# Patient Record
Sex: Male | Born: 2000
Health system: Southern US, Community
[De-identification: ages and names within clinical notes are randomized; demographics above are authoritative.]

## PROBLEM LIST (undated history)

## (undated) DIAGNOSIS — J309 Allergic rhinitis, unspecified: Secondary | ICD-10-CM

## (undated) DIAGNOSIS — L309 Dermatitis, unspecified: Secondary | ICD-10-CM

## (undated) DIAGNOSIS — R000 Tachycardia, unspecified: Secondary | ICD-10-CM

## (undated) HISTORY — DX: Allergic rhinitis, unspecified: J30.9

## (undated) HISTORY — PX: OTHER SURGICAL HISTORY: SHX169

## (undated) HISTORY — DX: Tachycardia, unspecified: R00.0

## (undated) HISTORY — DX: Dermatitis, unspecified: L30.9

---
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
## 2020-03-29 ENCOUNTER — Encounter: Payer: BC Managed Care – PPO | Admitting: Nurse Practitioner

## 2020-05-02 ENCOUNTER — Encounter: Payer: BC Managed Care – PPO | Admitting: Nurse Practitioner

## 2020-06-02 ENCOUNTER — Encounter: Payer: BC Managed Care – PPO | Admitting: Nurse Practitioner

## 2021-02-17 ENCOUNTER — Other Ambulatory Visit: Payer: Self-pay | Admitting: Nurse Practitioner

## 2021-02-17 ENCOUNTER — Other Ambulatory Visit: Payer: Self-pay

## 2021-02-17 ENCOUNTER — Encounter: Payer: Self-pay | Admitting: Nurse Practitioner

## 2021-02-17 ENCOUNTER — Ambulatory Visit (INDEPENDENT_AMBULATORY_CARE_PROVIDER_SITE_OTHER): Payer: BC Managed Care – PPO | Admitting: Nurse Practitioner

## 2021-02-17 VITALS — BP 141/84 | HR 86 | Temp 98.4°F | Resp 20 | Ht 71.0 in | Wt 152.0 lb

## 2021-02-17 DIAGNOSIS — N492 Inflammatory disorders of scrotum: Secondary | ICD-10-CM

## 2021-02-17 NOTE — Progress Notes (Signed)
Ordered changed to u/s scrotom with doppler

## 2021-02-17 NOTE — Progress Notes (Signed)
   Subjective:    Patient ID: Charles May, male    DOB: [birth year], 20 y.o.   MRN: [id number]   Chief Complaint: 2 spots near genital area   HPI Patient come sn with 2 little lumps on top of testicles. Noticed a couple of months ago. Have not changed in size. Nontender.    Review of Systems  Constitutional:  Negative for diaphoresis.  Eyes:  Negative for pain.  Respiratory:  Negative for shortness of breath.   Cardiovascular:  Negative for chest pain, palpitations and leg swelling.  Gastrointestinal:  Negative for abdominal pain.  Endocrine: Negative for polydipsia.  Skin:  Negative for rash.  Neurological:  Negative for dizziness, weakness and headaches.  Hematological:  Does not bruise/bleed easily.  All other systems reviewed and are negative.     Objective:   Physical Exam Constitutional:      Appearance: Normal appearance.  Cardiovascular:     Rate and Rhythm: Normal rate and regular rhythm.     Heart sounds: Normal heart sounds.  Pulmonary:     Effort: Pulmonary effort is normal.     Breath sounds: Normal breath sounds.  Genitourinary:    Penis: Normal.      Comments: 3cm nontender nodule upper left scrotal sac. Skin:    General: Skin is warm.  Neurological:     General: No focal deficit present.     Mental Status: He is alert and oriented to person, place, and time.  Psychiatric:        Mood and Affect: Mood normal.        Behavior: Behavior normal.    BP (!) 141/84   Pulse 86   Temp 98.4 F (36.9 C) (Temporal)   Resp 20   Ht 5\' 11"  (1.803 m)   Wt 152 lb (68.9 kg)   HC 20" (50.8 cm)   SpO2 100%   BMI 21.20 kg/m        Assessment & Plan:   in today with chief complaint of 2 spots near genital area   1. Scrotal nodule Continue to check for change in size Will call with schedule time for procedure - [name]; Future    The above assessment and management plan was discussed with the patient. The patient  verbalized understanding of and has agreed to the management plan. Patient is aware to call the clinic if symptoms persist or worsen. Patient is aware when to return to the clinic for a follow-up visit. Patient educated on when it is appropriate to go to the emergency department.   Charles [place], FNP

## 2021-02-24 ENCOUNTER — Ambulatory Visit (HOSPITAL_COMMUNITY)
Admission: RE | Admit: 2021-02-24 | Discharge: 2021-02-24 | Disposition: A | Discharge status: Home / Self Care | Payer: BC Managed Care – PPO | Source: Ambulatory Visit | Attending: Nurse Practitioner | Admitting: Nurse Practitioner

## 2021-02-24 ENCOUNTER — Other Ambulatory Visit: Payer: Self-pay

## 2021-02-24 DIAGNOSIS — N503 Cyst of epididymis: Secondary | ICD-10-CM | POA: Diagnosis not present

## 2021-02-24 DIAGNOSIS — N492 Inflammatory disorders of scrotum: Secondary | ICD-10-CM | POA: Insufficient documentation

## 2021-03-02 ENCOUNTER — Ambulatory Visit (INDEPENDENT_AMBULATORY_CARE_PROVIDER_SITE_OTHER): Payer: BC Managed Care – PPO | Admitting: Nurse Practitioner

## 2021-03-02 ENCOUNTER — Other Ambulatory Visit: Payer: Self-pay

## 2021-03-02 ENCOUNTER — Encounter: Payer: Self-pay | Admitting: Nurse Practitioner

## 2021-03-02 VITALS — BP 132/84 | HR 76 | Temp 98.1°F | Resp 20 | Ht 71.0 in | Wt 159.0 lb

## 2021-03-02 DIAGNOSIS — Z0000 Encounter for general adult medical examination without abnormal findings: Secondary | ICD-10-CM

## 2021-03-02 NOTE — Patient Instructions (Signed)
Exercising to Stay Healthy °To become healthy and stay healthy, it is recommended that you do moderate-intensity and vigorous-intensity exercise. You can tell that you are exercising at a moderate intensity if your heart starts beating faster and you start breathing faster but can still hold a conversation. You can tell that you are exercising at a vigorous intensity if you are breathing much harder and faster and cannot hold a conversation while exercising. °How can exercise benefit me? °Exercising regularly is important. It has many health benefits, such as: °Improving overall fitness, flexibility, and endurance. °Increasing bone density. °Helping with weight control. °Decreasing body fat. °Increasing muscle strength and endurance. °Reducing stress and tension, anxiety, depression, or anger. °Improving overall health. °What guidelines should I follow while exercising? °Before you start a new exercise program, talk with your health care provider. °Do not exercise so much that you hurt yourself, feel dizzy, or get very short of breath. °Wear comfortable clothes and wear shoes with good support. °Drink plenty of water while you exercise to prevent dehydration or heat stroke. °Work out until your breathing and your heartbeat get faster (moderate intensity). °How often should I exercise? °Choose an activity that you enjoy, and set realistic goals. Your health care provider can help you make an activity plan that is individually designed and works best for you. °Exercise regularly as told by your health care provider. This may include: °Doing strength training two times a week, such as: °Lifting weights. °Using resistance bands. °Push-ups. °Sit-ups. °Yoga. °Doing a certain intensity of exercise for a given amount of time. Choose from these options: °A total of 150 minutes of moderate-intensity exercise every week. °A total of 75 minutes of vigorous-intensity exercise every week. °A mix of moderate-intensity and  vigorous-intensity exercise every week. °Children, pregnant women, people who have not exercised regularly, people who are overweight, and older adults may need to talk with a health care provider about what activities are safe to perform. If you have a medical condition, be sure to talk with your health care provider before you start a new exercise program. °What are some exercise ideas? °Moderate-intensity exercise ideas include: °Walking 1 mile (1.6 km) in about 15 minutes. °Biking. °Hiking. °Golfing. °Dancing. °Water aerobics. °Vigorous-intensity exercise ideas include: °Walking 4.5 miles (7.2 km) or more in about 1 hour. °Jogging or running 5 miles (8 km) in about 1 hour. °Biking 10 miles (16.1 km) or more in about 1 hour. °Lap swimming. °Roller-skating or in-line skating. °Cross-country skiing. °Vigorous competitive sports, such as football, basketball, and soccer. °Jumping rope. °Aerobic dancing. °What are some everyday activities that can help me get exercise? °Yard work, such as: °Pushing a lawn mower. °Raking and bagging leaves. °Washing your car. °Pushing a stroller. °Shoveling snow. °Gardening. °Washing windows or floors. °How can I be more active in my day-to-day activities? °Use stairs instead of an elevator. °Take a walk during your lunch break. °If you drive, park your car farther away from your work or school. °If you take public transportation, get off one stop early and walk the rest of the way. °Stand up or walk around during all of your indoor phone calls. °Get up, stretch, and walk around every 30 minutes throughout the day. °Enjoy exercise with a friend. Support to continue exercising will help you keep a regular routine of activity. °Where to find more information °You can find more information about exercising to stay healthy from: °[place] Department of Health and Human Services: www.hhs.gov °Centers for Disease Control and Prevention (  CDC): www.cdc.gov °Summary °Exercising regularly is  important. It will improve your overall fitness, flexibility, and endurance. °Regular exercise will also improve your overall health. It can help you control your weight, reduce stress, and improve your bone density. °Do not exercise so much that you hurt yourself, feel dizzy, or get very short of breath. °Before you start a new exercise program, talk with your health care provider. °This information is not intended to replace advice given to you by your health care provider. Make sure you discuss any questions you have with your health care provider. °Document Revised: 09/16/2020 Document Reviewed: 09/16/2020 °Elsevier Patient Education © 2022 Elsevier Inc. ° °

## 2021-03-02 NOTE — Progress Notes (Signed)
   Subjective:    Patient ID: Charles May, male    DOB: July 29, 2000, 20 y.o.   MRN: [id number]   Chief Complaint: Annual Exam    HPI: Patient is here today for annual physical. He is doing well without complaints. He had a scrotal nodule and u/s showed benign nodule.  There are no diagnoses linked to this encounter.   No outpatient encounter medications on file as of 03/02/2021.   No facility-administered encounter medications on file as of 03/02/2021.    History reviewed. No pertinent surgical history.  History reviewed. No pertinent family history.  New complaints: None today  Social history: Lives with parents  Controlled substance contract: n/a     Review of Systems  Constitutional:  Negative for diaphoresis.  Eyes:  Negative for pain.  Respiratory:  Negative for shortness of breath.   Cardiovascular:  Negative for chest pain, palpitations and leg swelling.  Gastrointestinal:  Negative for abdominal pain.  Endocrine: Negative for polydipsia.  Skin:  Negative for rash.  Neurological:  Negative for dizziness, weakness and headaches.  Hematological:  Does not bruise/bleed easily.  All other systems reviewed and are negative.     Objective:   Physical Exam Vitals and nursing note reviewed.  Constitutional:      Appearance: Normal appearance. He is well-developed.  HENT:     Head: Normocephalic.     Nose: Nose normal.  Eyes:     Pupils: Pupils are equal, round, and reactive to light.  Neck:     Thyroid: No thyroid mass or thyromegaly.     Vascular: No carotid bruit or JVD.     Trachea: Phonation normal.  Cardiovascular:     Rate and Rhythm: Normal rate and regular rhythm.  Pulmonary:     Effort: Pulmonary effort is normal. No respiratory distress.     Breath sounds: Normal breath sounds.  Abdominal:     General: Bowel sounds are normal.     Palpations: Abdomen is soft.     Tenderness: There is no abdominal tenderness.  Musculoskeletal:         General: Normal range of motion.     Cervical back: Normal range of motion and neck supple.  Lymphadenopathy:     Cervical: No cervical adenopathy.  Skin:    General: Skin is warm and dry.  Neurological:     Mental Status: He is alert and oriented to person, place, and time.  Psychiatric:        Behavior: Behavior normal.        Thought Content: Thought content normal.        Judgment: Judgment normal.    BP 132/84   Pulse 76   Temp 98.1 F (36.7 C) (Temporal)   Resp 20   Ht $R'5\' 11"'Js$  (1.803 m)   Wt 159 lb (72.1 kg)   SpO2 99%   BMI 22.18 kg/m        Assessment & Plan:  Charles May comes in today with chief complaint of Annual Exam   Diagnosis and orders addressed:  1. Annual physical exam - CBC with Differential/Platelet - CMP14+EGFR - Lipid panel   Labs pending Health Maintenance reviewed Diet and exercise encouraged  Follow up plan: prn   Mary-Margaret Hassell Done, FNP

## 2021-03-03 LAB — CMP14+EGFR
ALT: 19 IU/L (ref 0–44)
AST: 17 IU/L (ref 0–40)
Albumin/Globulin Ratio: 2.3 — ABNORMAL HIGH (ref 1.2–2.2)
Albumin: 5 g/dL (ref 4.1–5.2)
Alkaline Phosphatase: 82 IU/L (ref 51–125)
BUN/Creatinine Ratio: 12 (ref 9–20)
BUN: 13 mg/dL (ref 6–20)
Bilirubin Total: 0.4 mg/dL (ref 0.0–1.2)
CO2: 23 mmol/L (ref 20–29)
Calcium: 10.1 mg/dL (ref 8.7–10.2)
Chloride: 102 mmol/L (ref 96–106)
Creatinine, Ser: 1.12 mg/dL (ref 0.76–1.27)
Globulin, Total: 2.2 g/dL (ref 1.5–4.5)
Glucose: 86 mg/dL (ref 70–99)
Potassium: 4.2 mmol/L (ref 3.5–5.2)
Sodium: 140 mmol/L (ref 134–144)
Total Protein: 7.2 g/dL (ref 6.0–8.5)
eGFR: 96 mL/min/{1.73_m2} (ref >59)

## 2021-03-03 LAB — LIPID PANEL
Chol/HDL Ratio: 3.8 ratio (ref 0.0–5.0)
Cholesterol, Total: 173 mg/dL (ref 100–199)
HDL: 45 mg/dL (ref >39)
LDL Chol Calc (NIH): 107 mg/dL — ABNORMAL HIGH (ref 0–99)
Triglycerides: 119 mg/dL (ref 0–149)
VLDL Cholesterol Cal: 21 mg/dL (ref 5–40)

## 2021-03-03 LAB — CBC WITH DIFFERENTIAL/PLATELET
Basophils Absolute: 0.1 10*3/uL (ref 0.0–0.2)
Basos: 1 %
EOS (ABSOLUTE): 0.3 10*3/uL (ref 0.0–0.4)
Eos: 5 %
Hematocrit: 44.7 % (ref 37.5–51.0)
Hemoglobin: 15.5 g/dL (ref 13.0–17.7)
Immature Grans (Abs): 0 10*3/uL (ref 0.0–0.1)
Immature Granulocytes: 0 %
Lymphocytes Absolute: 2 10*3/uL (ref 0.7–3.1)
Lymphs: 32 %
MCH: 30.6 pg (ref 26.6–33.0)
MCHC: 34.7 g/dL (ref 31.5–35.7)
MCV: 88 fL (ref 79–97)
Monocytes Absolute: 0.5 10*3/uL (ref 0.1–0.9)
Monocytes: 7 %
Neutrophils Absolute: 3.3 10*3/uL (ref 1.4–7.0)
Neutrophils: 55 %
Platelets: 198 10*3/uL (ref 150–450)
RBC: 5.06 x10E6/uL (ref 4.14–5.80)
RDW: 12.2 % (ref 11.6–15.4)
WBC: 6.1 10*3/uL (ref 3.4–10.8)

## 2021-06-16 DIAGNOSIS — R002 Palpitations: Secondary | ICD-10-CM | POA: Diagnosis not present

## 2021-06-16 DIAGNOSIS — R079 Chest pain, unspecified: Secondary | ICD-10-CM | POA: Diagnosis not present

## 2021-06-19 ENCOUNTER — Emergency Department (HOSPITAL_COMMUNITY)
Admission: EM | Admit: 2021-06-19 | Discharge: 2021-06-19 | Disposition: A | Discharge status: Home / Self Care | Payer: BC Managed Care – PPO | Attending: Emergency Medicine | Admitting: Emergency Medicine

## 2021-06-19 ENCOUNTER — Encounter (HOSPITAL_COMMUNITY): Payer: Self-pay

## 2021-06-19 ENCOUNTER — Other Ambulatory Visit: Payer: Self-pay

## 2021-06-19 ENCOUNTER — Emergency Department (HOSPITAL_COMMUNITY): Payer: BC Managed Care – PPO

## 2021-06-19 DIAGNOSIS — R002 Palpitations: Secondary | ICD-10-CM | POA: Diagnosis not present

## 2021-06-19 DIAGNOSIS — R079 Chest pain, unspecified: Secondary | ICD-10-CM | POA: Diagnosis not present

## 2021-06-19 LAB — BASIC METABOLIC PANEL
Anion gap: 11 (ref 5–15)
BUN: 12 mg/dL (ref 6–20)
CO2: 24 mmol/L (ref 22–32)
Calcium: 9.8 mg/dL (ref 8.9–10.3)
Chloride: 103 mmol/L (ref 98–111)
Creatinine, Ser: 1.2 mg/dL (ref 0.61–1.24)
GFR, Estimated: >60 mL/min (ref >60)
Glucose, Bld: 98 mg/dL (ref 70–99)
Potassium: 4.3 mmol/L (ref 3.5–5.1)
Sodium: 138 mmol/L (ref 135–145)

## 2021-06-19 LAB — CBC
HCT: 44.9 % (ref 39.0–52.0)
Hemoglobin: 15.9 g/dL (ref 13.0–17.0)
MCH: 31.4 pg (ref 26.0–34.0)
MCHC: 35.4 g/dL (ref 30.0–36.0)
MCV: 88.7 fL (ref 80.0–100.0)
Platelets: 213 10*3/uL (ref 150–400)
RBC: 5.06 MIL/uL (ref 4.22–5.81)
RDW: 11.8 % (ref 11.5–15.5)
WBC: 5 10*3/uL (ref 4.0–10.5)
nRBC: 0 % (ref 0.0–0.2)

## 2021-06-19 LAB — TROPONIN I (HIGH SENSITIVITY)
Troponin I (High Sensitivity): <2 ng/L (ref <18)
Troponin I (High Sensitivity): <2 ng/L (ref <18)

## 2021-06-19 NOTE — ED Triage Notes (Signed)
Pt arrived POV from home c/o CP that has been ongoing for about a week. Pt states his CP is on the left side. Pt also states he has been nauseous and vomited a few times. Per urgent care EKG and labs were normal sent here for further work up.

## 2021-06-19 NOTE — ED Provider Triage Note (Signed)
Emergency Medicine Provider Triage Evaluation Note  Charles May , a 21 y.o. male  was evaluated in triage.  Pt complains of left-sided chest pain x1 week.  He has associated resolved nausea, resolved nonbloody emesis.  Patient was evaluated in urgent care and sent to the ED for further evaluation.  He notes that his symptoms started a week ago after taking preworkout and noticing palpitations.  He notices this chest pain when he is up and active.  has not tried any medications for his symptoms.  Denies shortness of breath, fever, chills, abdominal pain.  Denies history of MI, CAD, hypertension, diabetes, cardiac catheterization.  Denies family history of MI in individual younger than 48.  Review of Systems  Positive: As per HPI above. Negative: Shortness of breath, fever, chills  Physical Exam  BP (!) 137/91 (BP Location: Right Arm)    Pulse 86    Temp 98.4 F (36.9 C) (Oral)    Resp 18    Ht 5\' 11"  (1.803 m)    Wt 72.6 kg    SpO2 99%    BMI 22.32 kg/m  Gen:   Awake, no distress   Resp:  Normal effort  MSK:   Moves extremities without difficulty  Other:  No chest wall tenderness to palpation.  Medical Decision Making  Medically screening exam initiated at 12:54 PM.  Appropriate orders placed.  Yahmir Sokolov was informed that the remainder of the evaluation will be completed by another provider, this initial triage assessment does not replace that evaluation, and the importance of remaining in the ED until their evaluation is complete.   Delicia Berens A, PA-C 06/19/21 1301

## 2021-06-19 NOTE — Discharge Instructions (Signed)
You were seen and evaluated in our ED on 06/19/21.  Reassuring heart enzymes and EKG.  Please follow up with the results of your Zio patch with your PCP.

## 2021-06-19 NOTE — ED Provider Notes (Signed)
[facility] EMERGENCY DEPARTMENT Provider Note   CSN: [id number] Arrival date & time: 06/19/21  1224     History  Chief Complaint  Patient presents with   Chest Pain    Aerik Polan is a 21 y.o. male.   Chest Pain Associated symptoms: palpitations    This is a 21 yo male with no significant PMH who presents to our ED for evaluation of palpitations and chest pains.   Patient states that he has been having intermittent chest pains and palpitations for the past week. His symptoms started after consuming (Ghost) Pre-workout. The patient initially presented to an urgent care where he had a reassuring workup and a zio patch was placed.   Patient also reports that he also has started to have anxiety recently when entering rooms. Denies trauma, chills, fevers, sob, leg swelling. Denies any family history of sudden cardiac death. Denies drug use. Has discontinued pre-workout usage.      Home Medications Prior to Admission medications   Medication Sig Start Date End Date Taking? Authorizing Provider  ibuprofen (ADVIL) 200 MG tablet Take 200 mg by mouth every 6 (six) hours as needed for fever or mild pain.   Yes [provider]      Allergies    Patient has no known allergies.    Review of Systems   Review of Systems  Cardiovascular:  Positive for chest pain and palpitations.  All other systems reviewed and are negative.  Physical Exam Updated Vital Signs BP 131/67    Pulse 67    Temp 99.4 F (37.4 C) (Oral)    Resp 20    Ht 5\' 11"  (1.803 m)    Wt 72.6 kg    SpO2 98%    BMI 22.32 kg/m  Physical Exam Vitals and nursing note reviewed.  Constitutional:      General: He is not in acute distress.    Appearance: He is well-developed.  HENT:     Head: Normocephalic and atraumatic.  Eyes:     Conjunctiva/sclera: Conjunctivae normal.  Cardiovascular:     Rate and Rhythm: Normal rate and regular rhythm.     Heart sounds: No murmur  heard. Pulmonary:     Effort: Pulmonary effort is normal. No respiratory distress.     Breath sounds: Normal breath sounds.  Abdominal:     Palpations: Abdomen is soft.     Tenderness: There is no abdominal tenderness.  Musculoskeletal:        General: No swelling.     Cervical back: Neck supple.     Right lower leg: No edema.     Left lower leg: No edema.  Skin:    General: Skin is warm and dry.     Capillary Refill: Capillary refill takes less than 2 seconds.  Neurological:     Mental Status: He is alert.  Psychiatric:        Mood and Affect: Mood normal.    ED Results / Procedures / Treatments   Labs (all labs ordered are listed, but only abnormal results are displayed) Labs Reviewed  BASIC METABOLIC PANEL  CBC  TROPONIN I (HIGH SENSITIVITY)  TROPONIN I (HIGH SENSITIVITY)    EKG EKG Interpretation  Date/Time:  Monday June 19 2021 12:54:49 EST Ventricular Rate:  87 PR Interval:  126 QRS Duration: 88 QT Interval:  342 QTC Calculation: 411 R Axis:   90 Text Interpretation: Normal sinus rhythm with sinus arrhythmia Rightward axis Nonspecific T wave abnormality  Abnormal ECG No previous ECGs available Confirmed by Linwood Dibbles [phone]) on 06/19/2021 4:51:26 PM  Radiology DG Chest 2 View  Result Date: 06/19/2021 CLINICAL DATA:  Chest pain EXAM: CHEST - 2 VIEW COMPARISON:  None. FINDINGS: The heart size and mediastinal contours are within normal limits. Both lungs are clear. The visualized skeletal structures are unremarkable. IMPRESSION: No acute abnormality of the lungs. Electronically Signed   By: Jearld Lesch M.D.   On: 06/19/2021 13:11    Procedures Procedures    Medications Ordered in ED Medications - No data to display  ED Course/ Medical Decision Making/ A&P                           Medical Decision Making  This is a 21 yo male presenting for chest pain and palpitations. Patient is HDS afebrile and non-toxic appearing.  Ddx considered but not limited  to the following: ACS, electrolyte abnormalities, dysthymia, anxiety.   Labs reviewed independently: negative troponin doubt ACS, Unremarkable CBC and BMP. CXR reviewed and without cardiac/pulmonary abnormality.   EKG reveals the patient to be in NSR without significant dysthymia. Recommended that the patient follow up with his PCP for results of zio patch. Doubt life threatening cause of palpations.   Will recommend  follow up with both PCP and Cardiology. Strict return precautions were provided which the patient voiced understanding of.     Final Clinical Impression(s) / ED Diagnoses Final diagnoses:  Palpitations    Rx / DC Orders ED Discharge Orders     None         Camila Li, MD 06/19/21 0263    Linwood Dibbles, MD 06/21/21 1649

## 2021-06-20 ENCOUNTER — Ambulatory Visit (INDEPENDENT_AMBULATORY_CARE_PROVIDER_SITE_OTHER): Payer: BC Managed Care – PPO | Admitting: Nurse Practitioner

## 2021-06-20 ENCOUNTER — Encounter: Payer: Self-pay | Admitting: Nurse Practitioner

## 2021-06-20 VITALS — BP 133/76 | HR 64 | Temp 98.7°F | Resp 20 | Ht 71.0 in | Wt 164.0 lb

## 2021-06-20 DIAGNOSIS — F419 Anxiety disorder, unspecified: Secondary | ICD-10-CM

## 2021-06-20 MED ORDER — CITALOPRAM HYDROBROMIDE 20 MG PO TABS: 20.0000 mg | ORAL_TABLET | Freq: Every day | ORAL | 5 refills | Status: DC

## 2021-06-20 NOTE — Progress Notes (Addendum)
Subjective:    Patient ID: Charles May, male    DOB: May 07, 2001, 21 y.o.   MRN: [id number]   Chief Complaint: anxiety  HPI Patient has bene going to the gym with his friends. They always drink a pre-work out prior to working out. For some reason on a couple of weeks ago he was at the gym and his heart started racing and he just did not feel right. He went to the ED and EKG showed no issues. He still did not feel right a few days later and he went back to the ED and again, everything checked out ok. He feels like that this may be anxiety. He says he can be doing nothing and all the sudden his heart will start racing. Can last over an hour when occurs. He says it occurred 3 time while he was sitting I our waiting room.  ( He had a younger brother pas away suddenly a year ago)  GAD 7 : Generalized Anxiety Score 06/20/2021 03/02/2021 02/17/2021  Nervous, Anxious, on Edge 1 0 0  Control/stop worrying 1 0 0  Worry too much - different things 1 0 0  Trouble relaxing 1 0 0  Restless 0 0 0  Easily annoyed or irritable 0 0 0  Afraid - awful might happen 1 0 0  Total GAD 7 Score 5 0 0  Anxiety Difficulty Somewhat difficult Not difficult at all Not difficult at all     Review of Systems  Constitutional:  Negative for diaphoresis.  Eyes:  Negative for pain.  Respiratory:  Negative for shortness of breath.   Cardiovascular:  Negative for chest pain, palpitations and leg swelling.  Gastrointestinal:  Negative for abdominal pain.  Endocrine: Negative for polydipsia.  Skin:  Negative for rash.  Neurological:  Negative for dizziness, weakness and headaches.  Hematological:  Does not bruise/bleed easily.  All other systems reviewed and are negative.     Objective:   Physical Exam Vitals and nursing note reviewed.  Constitutional:      Appearance: Normal appearance. He is well-developed.  Neck:     Thyroid: No thyroid mass or thyromegaly.     Vascular: No carotid bruit or JVD.      Trachea: Phonation normal.  Cardiovascular:     Rate and Rhythm: Normal rate and regular rhythm.  Pulmonary:     Effort: Pulmonary effort is normal. No respiratory distress.     Breath sounds: Normal breath sounds.  Abdominal:     General: Bowel sounds are normal.     Palpations: Abdomen is soft.     Tenderness: There is no abdominal tenderness.  Musculoskeletal:        General: Normal range of motion.     Cervical back: Normal range of motion and neck supple.  Lymphadenopathy:     Cervical: No cervical adenopathy.  Skin:    General: Skin is warm and dry.  Neurological:     Mental Status: He is alert and oriented to person, place, and time.  Psychiatric:        Behavior: Behavior normal.        Thought Content: Thought content normal.        Judgment: Judgment normal.    BP 133/76    Pulse 64    Temp 98.7 F (37.1 C) (Temporal)    Resp 20    Ht 5\' 11"  (1.803 m)    Wt 164 lb (74.4 kg)    SpO2 98%  BMI 22.87 kg/m    EKG- reviewed from hospital- Brendolyn Patty, FNP     Assessment & Plan:  Charles May in today with chief complaint of Anxiety   1. Anxiety Stress management Keep diary of heart racing: whn ,where, how long Avoid all pre-workout drinks as well and any energy drinks Follow up in 3 weeks - citalopram (CELEXA) 20 MG tablet; Take 1 tablet (20 mg total) by mouth daily.  Dispense: 30 tablet; Refill: 5    The above assessment and management plan was discussed with the patient. The patient verbalized understanding of and has agreed to the management plan. Patient is aware to call the clinic if symptoms persist or worsen. Patient is aware when to return to the clinic for a follow-up visit. Patient educated on when it is appropriate to go to the emergency department.   Mary-Margaret Daphine Deutscher, FNP

## 2021-06-20 NOTE — Patient Instructions (Signed)

## 2021-06-22 ENCOUNTER — Telehealth: Payer: Self-pay | Admitting: Nurse Practitioner

## 2021-06-22 NOTE — Telephone Encounter (Signed)
Okay for note

## 2021-06-22 NOTE — Telephone Encounter (Signed)
Note written and faxed to number patient provided. Patient notified and verbalized understanding that note was faxed.

## 2021-06-22 NOTE — Telephone Encounter (Signed)
Ok for note 

## 2021-06-22 NOTE — Telephone Encounter (Signed)
Pt needs a note for work, 1/18-1/20  Please fax note to [phone]

## 2021-06-25 DIAGNOSIS — I493 Ventricular premature depolarization: Secondary | ICD-10-CM | POA: Diagnosis not present

## 2021-06-26 DIAGNOSIS — Z6825 Body mass index (BMI) 25.0-25.9, adult: Secondary | ICD-10-CM | POA: Diagnosis not present

## 2021-06-26 DIAGNOSIS — E663 Overweight: Secondary | ICD-10-CM | POA: Diagnosis not present

## 2021-06-26 DIAGNOSIS — Z0000 Encounter for general adult medical examination without abnormal findings: Secondary | ICD-10-CM | POA: Diagnosis not present

## 2021-06-26 DIAGNOSIS — R000 Tachycardia, unspecified: Secondary | ICD-10-CM | POA: Diagnosis not present

## 2021-06-26 DIAGNOSIS — Z1331 Encounter for screening for depression: Secondary | ICD-10-CM | POA: Diagnosis not present

## 2021-06-28 DIAGNOSIS — R42 Dizziness and giddiness: Secondary | ICD-10-CM | POA: Diagnosis not present

## 2021-06-28 DIAGNOSIS — R110 Nausea: Secondary | ICD-10-CM | POA: Diagnosis not present

## 2021-06-28 DIAGNOSIS — R002 Palpitations: Secondary | ICD-10-CM | POA: Diagnosis not present

## 2021-06-28 DIAGNOSIS — R001 Bradycardia, unspecified: Secondary | ICD-10-CM | POA: Diagnosis not present

## 2021-07-11 ENCOUNTER — Ambulatory Visit: Payer: BC Managed Care – PPO | Admitting: Nurse Practitioner

## 2021-07-11 ENCOUNTER — Encounter: Payer: Self-pay | Admitting: Nurse Practitioner

## 2021-07-13 ENCOUNTER — Other Ambulatory Visit: Payer: Self-pay | Admitting: Nurse Practitioner

## 2021-07-13 DIAGNOSIS — F419 Anxiety disorder, unspecified: Secondary | ICD-10-CM

## 2021-07-21 ENCOUNTER — Encounter: Payer: Self-pay | Admitting: *Deleted

## 2021-07-25 ENCOUNTER — Ambulatory Visit (INDEPENDENT_AMBULATORY_CARE_PROVIDER_SITE_OTHER): Payer: BC Managed Care – PPO | Admitting: Cardiology

## 2021-07-25 ENCOUNTER — Encounter: Payer: Self-pay | Admitting: *Deleted

## 2021-07-25 ENCOUNTER — Encounter: Payer: Self-pay | Admitting: Cardiology

## 2021-07-25 VITALS — BP 116/82 | HR 74 | Ht 71.0 in | Wt 166.0 lb

## 2021-07-25 DIAGNOSIS — R002 Palpitations: Secondary | ICD-10-CM

## 2021-07-25 NOTE — Patient Instructions (Signed)
Medication Instructions:  Your physician recommends that you continue on your current medications as directed. Please refer to the Current Medication list given to you today.   Labwork: none  Testing/Procedures: none  Follow-Up:  Your physician recommends that you schedule a follow-up appointment in: Follow up as needed  Any Other Special Instructions Will Be Listed Below (If Applicable).  If you need a refill on your cardiac medications before your next appointment, please call your pharmacy.  

## 2021-07-25 NOTE — Progress Notes (Signed)
Clinical Summary Charles May is a 20 y.o.male seen today as a new patient.   1.Chest pain/Palpitations  - was at gym. Took Ghost preworkout, which was new for him. From internet review does appear to be a caffeinated supplement - worked out without troubles - on way home feeling of heart racing. Some SOB, lightheaded.  - lasted about 1 hour in total. - over the next few few weeks, short episodes of palpitations.  - last symptoms about 3 weeks ago   - coffee x 1-2 cups, 1 bottle soda, red bull every other day, occasional tea. Occasional EtOH beer/liquor. - wore monitor 48 hours, did have some mild episodes at the time.   - seen in ER Jan 2023 with palpitations, chest pain - trop neg x 2 - SR, high voltage likely young age related benign variant -normal thyroid studies Jan 2023  - no recent symptoms. Significantly cut back on caffeine intake.  - runs on treadmill x 25 min jog to fast run  2.Anxiety   Past Medical History:  Diagnosis Date   Allergic rhinitis    Eczema    Tachycardia      No Known Allergies   Current Outpatient Medications  Medication Sig Dispense Refill   citalopram (CELEXA) 20 MG tablet TAKE 1 TABLET BY MOUTH EVERY DAY 90 tablet 1   ibuprofen (ADVIL) 200 MG tablet Take 200 mg by mouth every 6 (six) hours as needed for fever or mild pain.     pantoprazole (PROTONIX) 40 MG tablet Take 40 mg by mouth daily.     No current facility-administered medications for this visit.     Past Surgical History:  Procedure Laterality Date   NONE       No Known Allergies    Family History  Problem Relation Age of Onset   Hypertension Father      Social History [name] reports that he has quit smoking. His smoking use included cigarettes. He has never used smokeless tobacco. [name] reports current alcohol use.   Review of Systems CONSTITUTIONAL: No weight loss, fever, chills, weakness or fatigue.  HEENT: Eyes: No visual loss, blurred vision,  double vision or yellow sclerae.No hearing loss, sneezing, congestion, runny nose or sore throat.  SKIN: No rash or itching.  CARDIOVASCULAR: per hpi RESPIRATORY: No shortness of breath, cough or sputum.  GASTROINTESTINAL: No anorexia, nausea, vomiting or diarrhea. No abdominal pain or blood.  GENITOURINARY: No burning on urination, no polyuria NEUROLOGICAL: No headache, dizziness, syncope, paralysis, ataxia, numbness or tingling in the extremities. No change in bowel or bladder control.  MUSCULOSKELETAL: No muscle, back pain, joint pain or stiffness.  LYMPHATICS: No enlarged nodes. No history of splenectomy.  PSYCHIATRIC: No history of depression or anxiety.  ENDOCRINOLOGIC: No reports of sweating, cold or heat intolerance. No polyuria or polydipsia.  Marland Kitchen   Physical Examination Today's Vitals   07/25/21 1435  BP: 116/82  Pulse: 74  SpO2: 98%  Weight: 166 lb (75.3 kg)  Height: 5\' 11"  (1.803 m)   Body mass index is 23.15 kg/m.  Gen: resting comfortably, no acute distress HEENT: no scleral icterus, pupils equal round and reactive, no palptable cervical adenopathy,  CV: RRR, no m/r/g no jvd Resp: Clear to auscultation bilaterally GI: abdomen is soft, non-tender, non-distended, normal bowel sounds, no hepatosplenomegaly MSK: extremities are warm, no edema.  Skin: warm, no rash Neuro:  no focal deficits Psych: appropriate affect      Assessment and Plan  1.Palpitations - likely big factor was very high caffeine intake, with weaning symptoms have resolved - monitor reviewed from [place]. Some what difficult interpretation as does not show onset of rhythm, 1-2 episodes of probable SVT - I suspect he had an epsidoe of SVT exacerbated by high caffeine intake - monitor at this time, if neccesary could start low dose lopressor, I thiink his bp and HRs would tolerate.  - as of now symptoms have resolved with weaning caffeine - I do not see an indication for an echo, would not order at  this time.    F/u as needed if recurrent symptoms      Antoine Poche, M.D.,

## 2021-08-01 ENCOUNTER — Ambulatory Visit (HOSPITAL_COMMUNITY)
Admission: RE | Admit: 2021-08-01 | Discharge: 2021-08-01 | Disposition: A | Discharge status: Home / Self Care | Payer: BC Managed Care – PPO | Source: Ambulatory Visit | Attending: Physician Assistant | Admitting: Physician Assistant

## 2021-08-01 ENCOUNTER — Other Ambulatory Visit (HOSPITAL_COMMUNITY): Payer: Self-pay | Admitting: Physician Assistant

## 2021-08-01 ENCOUNTER — Other Ambulatory Visit: Payer: Self-pay

## 2021-08-01 DIAGNOSIS — R0782 Intercostal pain: Secondary | ICD-10-CM | POA: Insufficient documentation

## 2021-08-01 DIAGNOSIS — E663 Overweight: Secondary | ICD-10-CM | POA: Diagnosis not present

## 2021-08-01 DIAGNOSIS — Z6825 Body mass index (BMI) 25.0-25.9, adult: Secondary | ICD-10-CM | POA: Diagnosis not present

## 2021-08-02 ENCOUNTER — Encounter (INDEPENDENT_AMBULATORY_CARE_PROVIDER_SITE_OTHER): Payer: Self-pay | Admitting: *Deleted

## 2021-09-04 DIAGNOSIS — Z6827 Body mass index (BMI) 27.0-27.9, adult: Secondary | ICD-10-CM | POA: Diagnosis not present

## 2021-09-04 DIAGNOSIS — A084 Viral intestinal infection, unspecified: Secondary | ICD-10-CM | POA: Diagnosis not present

## 2021-09-04 DIAGNOSIS — E663 Overweight: Secondary | ICD-10-CM | POA: Diagnosis not present

## 2021-10-09 ENCOUNTER — Ambulatory Visit (INDEPENDENT_AMBULATORY_CARE_PROVIDER_SITE_OTHER): Payer: BC Managed Care – PPO | Admitting: Gastroenterology

## 2021-10-09 ENCOUNTER — Encounter (INDEPENDENT_AMBULATORY_CARE_PROVIDER_SITE_OTHER): Payer: Self-pay | Admitting: Gastroenterology

## 2021-10-09 VITALS — BP 143/78 | HR 77 | Temp 97.9°F | Ht 71.0 in | Wt 161.1 lb

## 2021-10-09 DIAGNOSIS — K219 Gastro-esophageal reflux disease without esophagitis: Secondary | ICD-10-CM | POA: Diagnosis not present

## 2021-10-09 DIAGNOSIS — R142 Eructation: Secondary | ICD-10-CM | POA: Insufficient documentation

## 2021-10-09 MED ORDER — FAMOTIDINE 40 MG PO TABS: 40.0000 mg | ORAL_TABLET | Freq: Every day | ORAL | 1 refills | Status: DC

## 2021-10-09 NOTE — Patient Instructions (Addendum)
I have sent a prescription for Pepcid/famotidine to your pharmacy, take this in the mornings, 30 minutes before eating ? ?Be mindful of greasy, spicy, fried, citrus foods, caffeine, carbonated drinks, chocolate and alcohol can increase reflux symptoms ?Stay upright 2-3 hours after eating, prior to lying down and avoid eating late in the evenings. ? ?In regards to the gas you are noticing, you can try taking some over the counter gas x ? ?Please let me know if symptoms do not improve in the next few weeks or if you have worsening symptoms ? ?Follow up 6 months ? ? ? ?

## 2021-10-09 NOTE — Progress Notes (Signed)
? ?Referring Provider: Cory Munch, PA-C ?Primary Care Physician:  Cory Munch, PA-C (Inactive) ?Primary GI Physician: new ? ?Chief Complaint  ?Patient presents with  ? Gas  ?  Has a lot of burping and chest pain at times. Tried tums and it would for awhile. Prescribed protonix and did not like the way the med made him feel.   ? ?HPI:   ?Charles May is a 21 y.o. male with past medical history of Eczema, tachycardia.  ? ?Patient presenting today as a new patient for GERD. ? ?Started having acid reflux symptoms and belching in [place], started on protonix 40mg  daily in January by PCP, states that the medication made him feels "not himself" more foggy/fatigued. Reports that he took the PPI about 1 week, did not notice much improvement while on this. States he is using tums which helps, taking two a day. Reports that he has a lot of belching, even with just water, notices more heartburn and acid regurgitation at night, though symptoms can occur during the day as well. States that he cut out sodas which has helped some. Meal preps and normally does pretty healthy diet with eggs, yogurt, lean meats. States that he eats dinner around 9 pm and goes to bed around 10 or 1030. sometimes notices some chest pressure that improves with belching, feels that heartburn also improves after he belches. No nausea or vomiting. Denies changes in appetite or weight loss. Drinks maybe two sodas per week, does a lot of flavored water. Denies abdominal pain, rectal bleeding or melena. ? ?NSAID use: no ibuprofen ?Social hx: 2-3 beers on the weekend, occasionally drinks liquor ?Fam hx:no CRC or liver disease ? ?Last Colonoscopy:never ? ?Last Endoscopy:never ? ?Recommendations:  ? ? ?Past Medical History:  ?Diagnosis Date  ? Allergic rhinitis   ? Eczema   ? Tachycardia   ? ? ?Past Surgical History:  ?Procedure Laterality Date  ? NONE    ? ? ?Current Outpatient Medications  ?Medication Sig Dispense Refill  ? citalopram  (CELEXA) 20 MG tablet TAKE 1 TABLET BY MOUTH EVERY DAY (Patient not taking: Reported on 07/25/2021) 90 tablet 1  ? ibuprofen (ADVIL) 200 MG tablet Take 200 mg by mouth every 6 (six) hours as needed for fever or mild pain. (Patient not taking: Reported on 07/25/2021)    ? pantoprazole (PROTONIX) 40 MG tablet Take 40 mg by mouth daily. (Patient not taking: Reported on 07/25/2021)    ? ?No current facility-administered medications for this visit.  ? ? ?Allergies as of 10/09/2021  ? (No Known Allergies)  ? ? ?Family History  ?Problem Relation Age of Onset  ? Hypertension Father   ? ? ?Social History  ? ?Socioeconomic History  ? Marital status: Single  ?  Spouse name: Not on file  ? Number of children: Not on file  ? Years of education: Not on file  ? Highest education level: Not on file  ?Occupational History  ? Not on file  ?Tobacco Use  ? Smoking status: Every Day  ?  Types: Cigarettes, E-cigarettes  ?  Passive exposure: Current  ? Smokeless tobacco: Never  ?Substance and Sexual Activity  ? Alcohol use: Yes  ?  Comment: occasional  ? Drug use: No  ? Sexual activity: Not on file  ?Other Topics Concern  ? Not on file  ?Social History Narrative  ? Not on file  ? ?Social Determinants of Health  ? ?Financial Resource Strain: Not on file  ?Food Insecurity:  Not on file  ?Transportation Needs: Not on file  ?Physical Activity: Not on file  ?Stress: Not on file  ?Social Connections: Not on file  ? ?Review of systems ?General: negative for malaise, night sweats, fever, chills, weight loss ?Neck: Negative for lumps, goiter, pain and significant neck swelling ?Resp: Negative for cough, wheezing, dyspnea at rest ?CV: Negative for chest pain, leg swelling, palpitations, orthopnea ?GI: denies melena, hematochezia, nausea, vomiting, diarrhea, constipation, dysphagia, odyonophagia, early satiety or unintentional weight loss. +belching +acid reflux ?MSK: Negative for joint pain or swelling, back pain, and muscle pain. ?Derm: Negative for  itching or rash ?Psych: Denies depression, anxiety, memory loss, confusion. No homicidal or suicidal ideation.  ?Heme: Negative for prolonged bleeding, bruising easily, and swollen nodes. ?Endocrine: Negative for cold or heat intolerance, polyuria, polydipsia and goiter. ?Neuro: negative for tremor, gait imbalance, syncope and seizures. ?The remainder of the review of systems is noncontributory. ? ?Physical Exam: ?BP (!) 143/78 (BP Location: Left Arm, Patient Position: Sitting, Cuff Size: Normal)   Pulse 77   Temp 97.9 ?F (36.6 ?C) (Oral)   Ht 5\' 11"  (1.803 m)   Wt 161 lb 1.6 oz (73.1 kg)   BMI 22.47 kg/m?  ?General:   Alert and oriented. No distress noted. Pleasant and cooperative.  ?Head:  Normocephalic and atraumatic. ?Eyes:  Conjuctiva clear without scleral icterus. ?Mouth:  Oral mucosa pink and moist. Good dentition. No lesions. ?Heart: Normal rate and rhythm, s1 and s2 heart sounds present.  ?Lungs: Clear lung sounds in all lobes. Respirations equal and unlabored. ?Abdomen:  +BS, soft, non-tender and non-distended. No rebound or guarding. No HSM or masses noted. ?Derm: No palmar erythema or jaundice ?Msk:  Symmetrical without gross deformities. Normal posture. ?Extremities:  Without edema. ?Neurologic:  Alert and  oriented x4 ?Psych:  Alert and cooperative. Normal mood and affect. ? ?Invalid input(s): 6 MONTHS  ? ?ASSESSMENT: ?Charles May is a 21 y.o. male presenting today as a new patient for belching and acid reflux. ? ?Belching and acid reflux that started in January, no red flag symptoms, took protonix 40mg  daily x 1 week but felt it made him fatigued/gave him brain fog so he stopped it, using tums BID with relief. Denies any dysphagia, odynophagia, early satiety or bloating. Discussed dietary modifications/reflux precautions with patient to include being mindful that greasy, spicy, fried, citrus foods, caffeine, carbonated drinks, chocolate and alcohol can increase reflux symptoms. Stay  upright 2-3 hours after eating, prior to lying down and avoid eating late in the evenings. Will start famotidine 40mg  once daily in the morning as PPI previously made him feel fatigued, can try otc gas x to help with belching/excess gas, this is likely related to his diet. Pt will let me know if symptoms do not improve in the next few weeks. Will consider a different PPI if symptoms not improving on H2B. ? ?PLAN:  ?Start famotidine 40mg  daily ?2. Otc gas x ?3. Reflux precautions ?4. Pt to make me aware if symptoms fail to improve or worsen ? ?All questions were answered, patient verbalized understanding and is in agreement with plan as outlined above.  ? ? ?Follow Up: ?6 months ? ?Indria Bishara L. Alver Sorrow, MSN, APRN, AGNP-C ?Adult-Gerontology Nurse Practitioner ?[facility] ? ?

## 2021-12-07 DIAGNOSIS — E663 Overweight: Secondary | ICD-10-CM | POA: Diagnosis not present

## 2021-12-07 DIAGNOSIS — L600 Ingrowing nail: Secondary | ICD-10-CM | POA: Diagnosis not present

## 2021-12-07 DIAGNOSIS — Z6827 Body mass index (BMI) 27.0-27.9, adult: Secondary | ICD-10-CM | POA: Diagnosis not present

## 2021-12-25 DIAGNOSIS — F410 Panic disorder [episodic paroxysmal anxiety] without agoraphobia: Secondary | ICD-10-CM | POA: Diagnosis not present

## 2021-12-25 DIAGNOSIS — R55 Syncope and collapse: Secondary | ICD-10-CM | POA: Diagnosis not present

## 2021-12-25 DIAGNOSIS — K2100 Gastro-esophageal reflux disease with esophagitis, without bleeding: Secondary | ICD-10-CM | POA: Diagnosis not present

## 2021-12-25 DIAGNOSIS — R0789 Other chest pain: Secondary | ICD-10-CM | POA: Diagnosis not present

## 2021-12-25 DIAGNOSIS — Z6827 Body mass index (BMI) 27.0-27.9, adult: Secondary | ICD-10-CM | POA: Diagnosis not present

## 2021-12-26 DIAGNOSIS — R0789 Other chest pain: Secondary | ICD-10-CM | POA: Diagnosis not present

## 2021-12-26 DIAGNOSIS — R109 Unspecified abdominal pain: Secondary | ICD-10-CM | POA: Diagnosis not present

## 2021-12-26 DIAGNOSIS — R000 Tachycardia, unspecified: Secondary | ICD-10-CM | POA: Diagnosis not present

## 2021-12-26 DIAGNOSIS — K2100 Gastro-esophageal reflux disease with esophagitis, without bleeding: Secondary | ICD-10-CM | POA: Diagnosis not present

## 2022-01-30 ENCOUNTER — Ambulatory Visit
Admission: EM | Admit: 2022-01-30 | Discharge: 2022-01-30 | Disposition: A | Discharge status: Home / Self Care | Payer: BC Managed Care – PPO | Attending: Nurse Practitioner | Admitting: Nurse Practitioner

## 2022-01-30 DIAGNOSIS — R112 Nausea with vomiting, unspecified: Secondary | ICD-10-CM | POA: Diagnosis not present

## 2022-01-30 MED ORDER — ONDANSETRON 4 MG PO TBDP: 4.0000 mg | ORAL_TABLET | Freq: Three times a day (TID) | ORAL | 0 refills | Status: DC | PRN

## 2022-01-30 NOTE — Discharge Instructions (Signed)
-   Use the Zofran every 8 hours as needed for nausea/vomiting - Follow up with primary care provider regarding breathing

## 2022-01-30 NOTE — ED Triage Notes (Signed)
Pt reports this morning he had nausea and shortness of breath. Pt denies any symptoms at this moment. Pt reports he was with [place] at the beach and family was sick.

## 2022-01-30 NOTE — ED Provider Notes (Signed)
[facility]    CSN: [id number] Arrival date & time: 01/30/22  1525      History   Chief Complaint Chief Complaint  Patient presents with   Nausea    HPI Charles May is a 21 y.o. male.   Patient presents with nausea and vomiting that occurred earlier today.  Reports his family has been sick with similar symptoms and they were all recently at the beach together.  Reports he went home and slept and now feels much better.  He is now requesting a note for work.  Denies any current abdominal pain, nausea or vomiting.  He has been eating and drinking today and keep food and fluids down.  Denies diarrhea.   Also endorses some trouble taking in a deep breath intermittently.  Denies history of asthma or chronic lung disease.  Has a history of belching for which he takes Tums.  He has been prescribed a medication for the belching however has not picked it up from the pharmacy yet.     Past Medical History:  Diagnosis Date   Allergic rhinitis    Eczema    Tachycardia     Patient Active Problem List   Diagnosis Date Noted   Gastroesophageal reflux disease 10/09/2021   Belching 10/09/2021   Motor-verbal tic disorder 11/16/2012    Past Surgical History:  Procedure Laterality Date   NONE         Home Medications    Prior to Admission medications   Medication Sig Start Date End Date Taking? Authorizing Provider  ondansetron (ZOFRAN-ODT) 4 MG disintegrating tablet Take 1 tablet (4 mg total) by mouth every 8 (eight) hours as needed for nausea or vomiting. 01/30/22  Yes Valentino Nose, NP  citalopram (CELEXA) 20 MG tablet TAKE 1 TABLET BY MOUTH EVERY DAY Patient not taking: Reported on 07/25/2021 07/13/21   Bennie Pierini, FNP  famotidine (PEPCID) 40 MG tablet Take 1 tablet (40 mg total) by mouth daily. 10/09/21   Carlan, Chelsea L, NP  ibuprofen (ADVIL) 200 MG tablet Take 200 mg by mouth every 6 (six) hours as needed for fever or mild pain. Patient not  taking: Reported on 07/25/2021    [provider]  pantoprazole (PROTONIX) 40 MG tablet Take 40 mg by mouth daily. Patient not taking: Reported on 07/25/2021 06/27/21   [provider]    Family History Family History  Problem Relation Age of Onset   Hypertension Father     Social History Social History   Tobacco Use   Smoking status: Every Day    Types: Cigarettes, E-cigarettes    Passive exposure: Current   Smokeless tobacco: Never  Substance Use Topics   Alcohol use: Yes    Comment: occasional   Drug use: No     Allergies   Patient has no known allergies.   Review of Systems Review of Systems Per HPI  Physical Exam Triage Vital Signs ED Triage Vitals  Enc Vitals Group     BP 01/30/22 1532 (!) 149/78     Pulse Rate 01/30/22 1532 68     Resp 01/30/22 1532 16     Temp 01/30/22 1532 98.5 F (36.9 C)     Temp Source 01/30/22 1532 Oral     SpO2 01/30/22 1532 98 %     Weight --      Height --      Head Circumference --      Peak Flow --  Pain Score 01/30/22 1530 0     Pain Loc --      Pain Edu? --      Excl. in GC? --    No data found.  Updated Vital Signs BP (!) 149/78 (BP Location: Right Arm)   Pulse 68   Temp 98.5 F (36.9 C) (Oral)   Resp 16   SpO2 98%   Visual Acuity Right Eye Distance:   Left Eye Distance:   Bilateral Distance:    Right Eye Near:   Left Eye Near:    Bilateral Near:     Physical Exam Vitals and nursing note reviewed.  Constitutional:      General: He is not in acute distress.    Appearance: Normal appearance. He is not toxic-appearing.  HENT:     Head: Normocephalic and atraumatic.     Right Ear: Tympanic membrane, ear canal and external ear normal.     Left Ear: Tympanic membrane, ear canal and external ear normal.     Ears:     Comments: Right ear canal erythematous; non tender to palpation, no drainage    Mouth/Throat:     Mouth: Mucous membranes are moist.     Pharynx: Oropharynx is clear.  No posterior oropharyngeal erythema.  Cardiovascular:     Rate and Rhythm: Normal rate and regular rhythm.  Pulmonary:     Effort: Pulmonary effort is normal. No respiratory distress.     Breath sounds: Normal breath sounds. No wheezing, rhonchi or rales.  Abdominal:     General: Abdomen is flat. Bowel sounds are normal. There is no distension.     Palpations: Abdomen is soft.     Tenderness: There is no abdominal tenderness. There is no right CVA tenderness, left CVA tenderness, guarding or rebound.  Musculoskeletal:     Cervical back: Normal range of motion.  Lymphadenopathy:     Cervical: No cervical adenopathy.  Skin:    General: Skin is warm and dry.     Capillary Refill: Capillary refill takes less than 2 seconds.     Coloration: Skin is not jaundiced or pale.     Findings: No erythema.  Neurological:     Mental Status: He is alert.     Motor: No weakness.     Gait: Gait normal.  Psychiatric:        Behavior: Behavior is cooperative.      UC Treatments / Results  Labs (all labs ordered are listed, but only abnormal results are displayed) Labs Reviewed - No data to display  EKG   Radiology No results found.  Procedures Procedures (including critical care time)  Medications Ordered in UC Medications - No data to display  Initial Impression / Assessment and Plan / UC Course  I have reviewed the triage vital signs and the nursing notes.  Pertinent labs & imaging results that were available during my care of the patient were reviewed by me and considered in my medical decision making (see chart for details).    Patient is a very pleasant, well-appearing 21 year old male presenting for 1 episode of nausea and vomiting that occurred earlier today.  In triage, he is not tachycardic, blood pressure very slightly elevated, afebrile, not tachypneic, and oxygenating well on room air.  Suspect nausea/vomiting earlier today may be related to a viral gastroenteritis.   Supportive care recommended.  Start Zofran every 8 hours as needed for nausea/vomiting.  Note given for work.  I suspect the sensation he is describing  with his breathing may be related to ongoing/uncontrolled acid reflux.  Examination is unremarkable today and vital signs are stable.  Recommended close follow-up with primary care provider. Final Clinical Impressions(s) / UC Diagnoses   Final diagnoses:  Nausea and vomiting, unspecified vomiting type     Discharge Instructions      - Use the Zofran every 8 hours as needed for nausea/vomiting - Follow up with primary care provider regarding breathing    ED Prescriptions     Medication Sig Dispense Auth. Provider   ondansetron (ZOFRAN-ODT) 4 MG disintegrating tablet Take 1 tablet (4 mg total) by mouth every 8 (eight) hours as needed for nausea or vomiting. 20 tablet Valentino Nose, NP      PDMP not reviewed this encounter.   Valentino Nose, NP 01/30/22 [phone]

## 2022-01-31 ENCOUNTER — Encounter: Payer: Self-pay | Admitting: Emergency Medicine

## 2022-01-31 ENCOUNTER — Ambulatory Visit
Admission: EM | Admit: 2022-01-31 | Discharge: 2022-01-31 | Disposition: A | Discharge status: Home / Self Care | Payer: BC Managed Care – PPO | Attending: Nurse Practitioner | Admitting: Nurse Practitioner

## 2022-01-31 DIAGNOSIS — H66003 Acute suppurative otitis media without spontaneous rupture of ear drum, bilateral: Secondary | ICD-10-CM

## 2022-01-31 MED ORDER — AMOXICILLIN 875 MG PO TABS: 875.0000 mg | ORAL_TABLET | Freq: Two times a day (BID) | ORAL | 0 refills | Status: AC

## 2022-01-31 NOTE — Discharge Instructions (Signed)
-   Please start the amoxicillin 875 mg twice daily for 5 days for the ear infection - you can use Tylenol or Motrin as needed for ear pain - Zofran has been sent to the pharmacy, take this for nausea every 8 hours as needed

## 2022-01-31 NOTE — ED Triage Notes (Signed)
Left ear pain since last night

## 2022-01-31 NOTE — ED Provider Notes (Signed)
[facility]    CSN: [id number] Arrival date & time: 01/31/22  6269      History   Chief Complaint No chief complaint on file.   HPI Charles May is a 21 y.o. male.   Patient presents with 1 day of left ear pain.  He was seen in urgent care last night for a separate problem, and at that time I informed him that it looked like his left ear was infected.  He declined antibiotic treatment at that time because he is not having any symptoms.  Reports around 8 PM last night, his left ear started hurting.  Denies any fever, cough, congestion, ear drainage, sore throat.  Denies hearing loss, recent water immersion, or use of Q-tips.  Has not taken anything for the pain so far.    Past Medical History:  Diagnosis Date   Allergic rhinitis    Eczema    Tachycardia     Patient Active Problem List   Diagnosis Date Noted   Gastroesophageal reflux disease 10/09/2021   Belching 10/09/2021   Motor-verbal tic disorder 11/16/2012    Past Surgical History:  Procedure Laterality Date   NONE         Home Medications    Prior to Admission medications   Medication Sig Start Date End Date Taking? Authorizing Provider  amoxicillin (AMOXIL) 875 MG tablet Take 1 tablet (875 mg total) by mouth 2 (two) times daily for 5 days. 01/31/22 02/05/22 Yes Valentino Nose, NP  citalopram (CELEXA) 20 MG tablet TAKE 1 TABLET BY MOUTH EVERY DAY Patient not taking: Reported on 07/25/2021 07/13/21   Bennie Pierini, FNP  famotidine (PEPCID) 40 MG tablet Take 1 tablet (40 mg total) by mouth daily. 10/09/21   Carlan, Chelsea L, NP  ibuprofen (ADVIL) 200 MG tablet Take 200 mg by mouth every 6 (six) hours as needed for fever or mild pain. Patient not taking: Reported on 07/25/2021    [provider]  ondansetron (ZOFRAN-ODT) 4 MG disintegrating tablet Take 1 tablet (4 mg total) by mouth every 8 (eight) hours as needed for nausea or vomiting. 01/30/22   Valentino Nose, NP   pantoprazole (PROTONIX) 40 MG tablet Take 40 mg by mouth daily. Patient not taking: Reported on 07/25/2021 06/27/21   [provider]    Family History Family History  Problem Relation Age of Onset   Hypertension Father     Social History Social History   Tobacco Use   Smoking status: Every Day    Types: Cigarettes, E-cigarettes    Passive exposure: Current   Smokeless tobacco: Never  Substance Use Topics   Alcohol use: Yes    Comment: occasional   Drug use: No     Allergies   Patient has no known allergies.   Review of Systems Review of Systems Per HPI  Physical Exam Triage Vital Signs ED Triage Vitals [01/31/22 0906]  Enc Vitals Group     BP 119/77     Pulse Rate 64     Resp 18     Temp 98.1 F (36.7 C)     Temp Source Oral     SpO2 98 %     Weight      Height      Head Circumference      Peak Flow      Pain Score 2     Pain Loc      Pain Edu?      Excl. in GC?  No data found.  Updated Vital Signs BP 119/77 (BP Location: Right Arm)   Pulse 64   Temp 98.1 F (36.7 C) (Oral)   Resp 18   SpO2 98%   Visual Acuity Right Eye Distance:   Left Eye Distance:   Bilateral Distance:    Right Eye Near:   Left Eye Near:    Bilateral Near:     Physical Exam Vitals and nursing note reviewed.  Constitutional:      General: He is not in acute distress.    Appearance: Normal appearance. He is not toxic-appearing.  HENT:     Right Ear: No decreased hearing noted. No drainage, swelling or tenderness. A middle ear effusion is present. Tympanic membrane is injected and erythematous. Tympanic membrane is not perforated.     Left Ear: No decreased hearing noted. No drainage, swelling or tenderness. A middle ear effusion is present. Tympanic membrane is injected and erythematous. Tympanic membrane is not perforated.     Nose: Nose normal. No congestion or rhinorrhea.     Mouth/Throat:     Mouth: Mucous membranes are moist.     Pharynx: Oropharynx  is clear. Posterior oropharyngeal erythema present.  Eyes:     General: No scleral icterus.       Right eye: No discharge.        Left eye: No discharge.     Extraocular Movements: Extraocular movements intact.  Pulmonary:     Effort: Pulmonary effort is normal. No respiratory distress.  Skin:    General: Skin is warm and dry.     Capillary Refill: Capillary refill takes less than 2 seconds.     Coloration: Skin is not jaundiced or pale.     Findings: No erythema or rash.  Neurological:     Mental Status: He is alert and oriented to person, place, and time.  Psychiatric:        Behavior: Behavior is cooperative.      UC Treatments / Results  Labs (all labs ordered are listed, but only abnormal results are displayed) Labs Reviewed - No data to display  EKG   Radiology No results found.  Procedures Procedures (including critical care time)  Medications Ordered in UC Medications - No data to display  Initial Impression / Assessment and Plan / UC Course  I have reviewed the triage vital signs and the nursing notes.  Pertinent labs & imaging results that were available during my care of the patient were reviewed by me and considered in my medical decision making (see chart for details).    Patient is a very pleasant, well-appearing 21 year old male presenting for left ear pain.  In triage, he is normotensive, not tachycardic, afebrile, not tachypneic, and oxygenating well on room air.  Examination reveals bilateral otitis media.  Will treat with amoxicillin 875 mg twice daily for 5 days.  ER precautions and return precautions discussed.  Note given for work.  The patient was given the opportunity to ask questions.  All questions answered to their satisfaction.  The patient is in agreement to this plan.   Final Clinical Impressions(s) / UC Diagnoses   Final diagnoses:  Non-recurrent acute suppurative otitis media of both ears without spontaneous rupture of tympanic membranes      Discharge Instructions      - Please start the amoxicillin 875 mg twice daily for 5 days for the ear infection - you can use Tylenol or Motrin as needed for ear pain - Zofran has been  sent to the pharmacy, take this for nausea every 8 hours as needed     ED Prescriptions     Medication Sig Dispense Auth. Provider   amoxicillin (AMOXIL) 875 MG tablet Take 1 tablet (875 mg total) by mouth 2 (two) times daily for 5 days. 10 tablet Valentino Nose, NP      PDMP not reviewed this encounter.   Valentino Nose, NP 01/31/22 [phone]

## 2022-02-10 ENCOUNTER — Ambulatory Visit
Admission: EM | Admit: 2022-02-10 | Discharge: 2022-02-10 | Discharge status: Against Medical Advice | Payer: BC Managed Care – PPO

## 2022-02-11 ENCOUNTER — Encounter: Payer: Self-pay | Admitting: Emergency Medicine

## 2022-02-11 ENCOUNTER — Ambulatory Visit: Payer: BC Managed Care – PPO

## 2022-02-11 ENCOUNTER — Other Ambulatory Visit: Payer: Self-pay

## 2022-02-11 ENCOUNTER — Ambulatory Visit
Admission: EM | Admit: 2022-02-11 | Discharge: 2022-02-11 | Disposition: A | Discharge status: Home / Self Care | Payer: BC Managed Care – PPO | Attending: Physician Assistant | Admitting: Physician Assistant

## 2022-02-11 DIAGNOSIS — L255 Unspecified contact dermatitis due to plants, except food: Secondary | ICD-10-CM | POA: Diagnosis not present

## 2022-02-11 MED ORDER — PREDNISONE 10 MG PO TABS: ORAL_TABLET | ORAL | 0 refills | Status: DC

## 2022-02-11 NOTE — ED Triage Notes (Signed)
Pt reports right sided facial swelling that is now over right eye x2 days. 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 Pt denies any shortness of breath, difficulty swallowing. Airway patent. Last took benadryl last night. Has tried otc creams with no change as well.

## 2022-02-11 NOTE — ED Provider Notes (Signed)
[facility]    CSN: [id number] Arrival date & time: 02/11/22  1049      History   Chief Complaint Chief Complaint  Patient presents with   Facial Swelling    HPI Charles May is a 21 y.o. male.   Patient presents today with a several day history of pruritic rash on his face.  Reports that multiple people in his work group have had similar symptoms as they work outside for [employer].  They suspect that they were exposed to poison ivy or poison oak.  He reports that symptoms initially were blistery but have crusted over and he has had some serous drainage.  He has been applying calamine lotion and taking Benadryl with only temporary relief of symptoms.  He denies any swelling of his throat, shortness of breath, shortness of breath, dysphagia, fever.  Denies any spread of rash.  He does report that he is improving but given his to close to his eye and he continues to have swelling he is presenting today for potential treatment.    Past Medical History:  Diagnosis Date   Allergic rhinitis    Eczema    Tachycardia     Patient Active Problem List   Diagnosis Date Noted   Gastroesophageal reflux disease 10/09/2021   Belching 10/09/2021   Motor-verbal tic disorder 11/16/2012    Past Surgical History:  Procedure Laterality Date   NONE         Home Medications    Prior to Admission medications   Medication Sig Start Date End Date Taking? Authorizing Provider  predniSONE (DELTASONE) 10 MG tablet Take 40 mg (4 tablets) daily for days 1-3, take 30 mg (3 tablets) days 4-6, take 20 mg (2 tablets) days 7-9, take 10 milligrams (1 tablet) day 10-12 then stop. 02/11/22  Yes Deja Pisarski K, PA-C  citalopram (CELEXA) 20 MG tablet TAKE 1 TABLET BY MOUTH EVERY DAY Patient not taking: Reported on 07/25/2021 07/13/21   Bennie Pierini, FNP  ibuprofen (ADVIL) 200 MG tablet Take 200 mg by mouth every 6 (six) hours as needed for fever or mild pain. Patient not  taking: Reported on 07/25/2021    [provider]  pantoprazole (PROTONIX) 40 MG tablet Take 40 mg by mouth daily. Patient not taking: Reported on 07/25/2021 06/27/21   [provider]    Family History Family History  Problem Relation Age of Onset   Hypertension Father     Social History Social History   Tobacco Use   Smoking status: Every Day    Types: Cigarettes, E-cigarettes    Passive exposure: Current   Smokeless tobacco: Never  Substance Use Topics   Alcohol use: Yes    Comment: occasional   Drug use: No     Allergies   Patient has no known allergies.   Review of Systems Review of Systems  Constitutional:  Positive for activity change. Negative for appetite change, fatigue and fever.  HENT:  Negative for sore throat, trouble swallowing and voice change.   Respiratory:  Negative for cough and shortness of breath.   Cardiovascular:  Negative for chest pain.  Gastrointestinal:  Negative for abdominal pain, diarrhea, nausea and vomiting.  Skin:  Positive for rash.     Physical Exam Triage Vital Signs ED Triage Vitals  Enc Vitals Group     BP 02/11/22 1131 (!) 146/91     Pulse Rate 02/11/22 1131 75     Resp 02/11/22 1131 18  Temp 02/11/22 1131 98.7 F (37.1 C)     Temp Source 02/11/22 1131 Oral     SpO2 02/11/22 1131 98 %     Weight --      Height --      Head Circumference --      Peak Flow --      Pain Score 02/11/22 1132 0     Pain Loc --      Pain Edu? --      Excl. in GC? --    No data found.  Updated Vital Signs BP (!) 146/91 (BP Location: Right Arm)   Pulse 75   Temp 98.7 F (37.1 C) (Oral)   Resp 18   SpO2 98%   Visual Acuity Right Eye Distance:   Left Eye Distance:   Bilateral Distance:    Right Eye Near:   Left Eye Near:    Bilateral Near:     Physical Exam Vitals reviewed.  Constitutional:      General: He is awake.     Appearance: Normal appearance. He is well-developed. He is not ill-appearing.      Comments: Very pleasant male appears stated age in no acute distress sitting comfortably in exam room  HENT:     Head: Normocephalic and atraumatic.     Mouth/Throat:     Pharynx: Uvula midline. No oropharyngeal exudate or posterior oropharyngeal erythema.     Comments: Normal-appearing posterior pharynx Eyes:     Extraocular Movements: Extraocular movements intact.     Conjunctiva/sclera: Conjunctivae normal.  Cardiovascular:     Rate and Rhythm: Normal rate and regular rhythm.     Heart sounds: Normal heart sounds, S1 normal and S2 normal. No murmur heard. Pulmonary:     Effort: Pulmonary effort is normal.     Breath sounds: Normal breath sounds. No stridor. No wheezing, rhonchi or rales.     Comments: Clear to auscultation bilaterally Skin:    Findings: Rash present. Rash is macular and papular.     Comments: Maculopapular rash with evidence of excoriation noted over right second metacarpal with associated erythema and swelling involving right temporal area and supraorbital region.  Neurological:     Mental Status: He is alert.  Psychiatric:        Behavior: Behavior is cooperative.      UC Treatments / Results  Labs (all labs ordered are listed, but only abnormal results are displayed) Labs Reviewed - No data to display  EKG   Radiology No results found.  Procedures Procedures (including critical care time)  Medications Ordered in UC Medications - No data to display  Initial Impression / Assessment and Plan / UC Course  I have reviewed the triage vital signs and the nursing notes.  Pertinent labs & imaging results that were available during my care of the patient were reviewed by me and considered in my medical decision making (see chart for details).     Patient is well-appearing, afebrile, nontoxic, nontachycardic.  Concern for Rhus dermatitis.  Patient was started on prednisone taper with instruction not to take NSAIDs with this medication due to risk of GI  bleeding.  He is to avoid prolonged sun exposure.  He was given work excuse note for several days in order to allow him to take medication as prescribed.  Discussed that if he has any worsening symptoms including spread of rash, swelling of eye, visual disturbance, shortness of breath, dysphagia he needs to be seen immediately.  Can continue over-the-counter  medications including antihistamines for symptom relief.  Strict return precautions given.  Final Clinical Impressions(s) / UC Diagnoses   Final diagnoses:  Rhus dermatitis     Discharge Instructions      Start prednisone taper as prescribed.  Do not take NSAIDs including aspirin, ibuprofen/Advil, naproxen/Aleve with this medication as it can cause stomach bleeding.  Stay out of the sun while on this medicine.  You can continue Benadryl for itching relief.  You can use topical cream for symptom relief.  If you have any worsening symptoms including shortness of breath, swelling of your throat, nausea, vomiting, spread of rash, swelling of your eye, vision change you need to go to the emergency room.  If symptoms are not improving please return for reevaluation.     ED Prescriptions     Medication Sig Dispense Auth. Provider   predniSONE (DELTASONE) 10 MG tablet Take 40 mg (4 tablets) daily for days 1-3, take 30 mg (3 tablets) days 4-6, take 20 mg (2 tablets) days 7-9, take 10 milligrams (1 tablet) day 10-12 then stop. 30 tablet Zeddie Njie, Noberto Retort, PA-C      PDMP not reviewed this encounter.   Jeani Hawking, PA-C 02/11/22 1200

## 2022-02-11 NOTE — Discharge Instructions (Addendum)
Start prednisone taper as prescribed.  Do not take NSAIDs including aspirin, ibuprofen/Advil, naproxen/Aleve with this medication as it can cause stomach bleeding.  Stay out of the sun while on this medicine.  You can continue Benadryl for itching relief.  You can use topical cream for symptom relief.  If you have any worsening symptoms including shortness of breath, swelling of your throat, nausea, vomiting, spread of rash, swelling of your eye, vision change you need to go to the emergency room.  If symptoms are not improving please return for reevaluation.

## 2022-02-14 ENCOUNTER — Ambulatory Visit
Admission: EM | Admit: 2022-02-14 | Discharge: 2022-02-14 | Disposition: A | Discharge status: Home / Self Care | Payer: BC Managed Care – PPO

## 2022-02-14 ENCOUNTER — Encounter: Payer: Self-pay | Admitting: Emergency Medicine

## 2022-02-14 DIAGNOSIS — R42 Dizziness and giddiness: Secondary | ICD-10-CM | POA: Diagnosis not present

## 2022-02-14 DIAGNOSIS — R110 Nausea: Secondary | ICD-10-CM

## 2022-02-14 LAB — POCT URINALYSIS DIP (MANUAL ENTRY)
Bilirubin, UA: NEGATIVE
Blood, UA: NEGATIVE
Glucose, UA: NEGATIVE mg/dL
Ketones, POC UA: NEGATIVE mg/dL
Leukocytes, UA: NEGATIVE
Nitrite, UA: NEGATIVE
Protein Ur, POC: NEGATIVE mg/dL
Spec Grav, UA: 1.015 (ref 1.010–1.025)
Urobilinogen, UA: 0.2 E.U./dL
pH, UA: 7 (ref 5.0–8.0)

## 2022-02-14 LAB — POCT FASTING CBG KUC MANUAL ENTRY: POCT Glucose (KUC): 110 mg/dL — AB (ref 70–99)

## 2022-02-14 MED ORDER — ONDANSETRON 4 MG PO TBDP: 4.0000 mg | ORAL_TABLET | Freq: Three times a day (TID) | ORAL | 0 refills | Status: DC | PRN

## 2022-02-14 NOTE — ED Triage Notes (Signed)
Nauseated and lightheaded since yesterday.  Needs a note for work.

## 2022-02-14 NOTE — Discharge Instructions (Signed)
Make sure to drink plenty of fluids, including electrolyte solutions such as Pedialyte or Gatorade.  Rest, take the Zofran as needed for nausea and return for any worsening symptoms.

## 2022-02-14 NOTE — ED Provider Notes (Signed)
[facility]    CSN: [id number] Arrival date & time: 02/14/22  0808      History   Chief Complaint No chief complaint on file.   HPI Charles May is a 21 y.o. male.   Patient presenting today with 1 day history of nausea, lightheadedness off-and-on throughout the day.  States it is seemingly worse when he is watching other cars drive by doing anything where things are moving around him.  He denies headache, visual change, chest pain, shortness of breath, palpitations, vomiting, diarrhea, fever, chills, body aches.  He states he was just seen several days ago for poison ivy dermatitis but never started his prednisone taper, has been using topical creams which has been working well.  No new medications, supplements, foods, routine changes.  Drinks tons of water per patient.  Denies any known pertinent past medical history.  Not trying any medications for symptoms.    Past Medical History:  Diagnosis Date   Allergic rhinitis    Eczema    Tachycardia     Patient Active Problem List   Diagnosis Date Noted   Gastroesophageal reflux disease 10/09/2021   Belching 10/09/2021   Motor-verbal tic disorder 11/16/2012    Past Surgical History:  Procedure Laterality Date   NONE         Home Medications    Prior to Admission medications   Medication Sig Start Date End Date Taking? Authorizing Provider  diphenhydrAMINE (BENADRYL) 50 MG capsule Take 50 mg by mouth every 6 (six) hours as needed.   Yes [provider]  ondansetron (ZOFRAN-ODT) 4 MG disintegrating tablet Take 1 tablet (4 mg total) by mouth every 8 (eight) hours as needed for nausea or vomiting. 02/14/22  Yes Particia Nearing, PA-C  citalopram (CELEXA) 20 MG tablet TAKE 1 TABLET BY MOUTH EVERY DAY Patient not taking: Reported on 07/25/2021 07/13/21   Bennie Pierini, FNP  ibuprofen (ADVIL) 200 MG tablet Take 200 mg by mouth every 6 (six) hours as needed for fever or mild pain. Patient  not taking: Reported on 07/25/2021    [provider]  pantoprazole (PROTONIX) 40 MG tablet Take 40 mg by mouth daily. Patient not taking: Reported on 07/25/2021 06/27/21   [provider]  predniSONE (DELTASONE) 10 MG tablet Take 40 mg (4 tablets) daily for days 1-3, take 30 mg (3 tablets) days 4-6, take 20 mg (2 tablets) days 7-9, take 10 milligrams (1 tablet) day 10-12 then stop. 02/11/22   Raspet, Noberto Retort, PA-C    Family History Family History  Problem Relation Age of Onset   Hypertension Father     Social History Social History   Tobacco Use   Smoking status: Former    Types: Cigarettes, E-cigarettes    Passive exposure: Current   Smokeless tobacco: Never  Vaping Use   Vaping Use: Never used  Substance Use Topics   Alcohol use: Yes    Comment: occasional   Drug use: No     Allergies   Patient has no known allergies.   Review of Systems Review of Systems Per HPI  Physical Exam Triage Vital Signs ED Triage Vitals [02/14/22 0820]  Enc Vitals Group     BP 128/78     Pulse Rate 65     Resp 16     Temp 98.2 F (36.8 C)     Temp Source Oral     SpO2 98 %     Weight  Height      Head Circumference      Peak Flow      Pain Score 0     Pain Loc      Pain Edu?      Excl. in GC?    Orthostatic VS for the past 24 hrs:  BP- Lying Pulse- Lying BP- Sitting Pulse- Sitting BP- Standing at 0 minutes Pulse- Standing at 0 minutes  02/14/22 0838 128/77 54 134/82 65 130/80 73    Updated Vital Signs BP 128/78 (BP Location: Right Arm)   Pulse 65   Temp 98.2 F (36.8 C) (Oral)   Resp 16   SpO2 98%   Visual Acuity Right Eye Distance:   Left Eye Distance:   Bilateral Distance:    Right Eye Near:   Left Eye Near:    Bilateral Near:     Physical Exam Vitals and nursing note reviewed.  Constitutional:      Appearance: He is well-developed.  HENT:     Head: Atraumatic.     Mouth/Throat:     Mouth: Mucous membranes are moist.     Pharynx: No  oropharyngeal exudate.  Eyes:     Conjunctiva/sclera: Conjunctivae normal.     Pupils: Pupils are equal, round, and reactive to light.  Cardiovascular:     Rate and Rhythm: Normal rate and regular rhythm.     Heart sounds: Normal heart sounds.  Pulmonary:     Effort: Pulmonary effort is normal. No respiratory distress.     Breath sounds: No wheezing or rales.  Abdominal:     General: Bowel sounds are normal. There is no distension.     Palpations: Abdomen is soft.     Tenderness: There is no abdominal tenderness. There is no guarding.  Musculoskeletal:        General: Normal range of motion.     Cervical back: Normal range of motion and neck supple.  Lymphadenopathy:     Cervical: No cervical adenopathy.  Skin:    General: Skin is warm and dry.  Neurological:     Mental Status: He is alert and oriented to person, place, and time.     Cranial Nerves: No cranial nerve deficit.     Motor: No weakness.     Gait: Gait normal.  Psychiatric:        Behavior: Behavior normal.    UC Treatments / Results  Labs (all labs ordered are listed, but only abnormal results are displayed) Labs Reviewed  POCT FASTING CBG [facility] MANUAL ENTRY - Abnormal; Notable for the following components:      Result Value   POCT Glucose ([facility]) 110 (*)    All other components within normal limits  POCT URINALYSIS DIP (MANUAL ENTRY)    EKG   Radiology No results found.  Procedures Procedures (including critical care time)  Medications Ordered in UC Medications - No data to display  Initial Impression / Assessment and Plan / UC Course  I have reviewed the triage vital signs and the nursing notes.  Pertinent labs & imaging results that were available during my care of the patient were reviewed by me and considered in my medical decision making (see chart for details).     Vital signs and exam completely benign and reassuring today with no obvious abnormalities.  Orthostatic vital signs within normal  limits, urinalysis without evidence of infection or dehydration, point-of-care random glucose benign.  He declines EKG, labs or other further work-up today.  We  will treat with Zofran, fluids, rest and provide a work note.  Discussed to follow-up for worsening or unimproving symptoms.  Final Clinical Impressions(s) / UC Diagnoses   Final diagnoses:  Lightheaded  Nausea without vomiting     Discharge Instructions      Make sure to drink plenty of fluids, including electrolyte solutions such as Pedialyte or Gatorade.  Rest, take the Zofran as needed for nausea and return for any worsening symptoms.    ED Prescriptions     Medication Sig Dispense Auth. Provider   ondansetron (ZOFRAN-ODT) 4 MG disintegrating tablet Take 1 tablet (4 mg total) by mouth every 8 (eight) hours as needed for nausea or vomiting. 20 tablet Particia Nearing, [place]      PDMP not reviewed this encounter.   Roosvelt Maser [place], [place] 02/14/22 [id number]

## 2022-02-22 ENCOUNTER — Ambulatory Visit
Admission: RE | Admit: 2022-02-22 | Discharge: 2022-02-22 | Disposition: A | Discharge status: Home / Self Care | Payer: BC Managed Care – PPO | Source: Ambulatory Visit | Attending: Nurse Practitioner | Admitting: Nurse Practitioner

## 2022-02-22 VITALS — BP 124/81 | HR 72 | Temp 97.1°F | Resp 16

## 2022-02-22 DIAGNOSIS — B349 Viral infection, unspecified: Secondary | ICD-10-CM | POA: Diagnosis not present

## 2022-02-22 DIAGNOSIS — J029 Acute pharyngitis, unspecified: Secondary | ICD-10-CM | POA: Insufficient documentation

## 2022-02-22 LAB — POCT RAPID STREP A (OFFICE): Rapid Strep A Screen: NEGATIVE

## 2022-02-22 NOTE — ED Provider Notes (Signed)
[facility]    CSN: [id number] Arrival date & time: 02/22/22  1420      History   Chief Complaint Chief Complaint  Patient presents with   Sore Throat    Entered by patient    HPI Charles May is a 21 y.o. male.   The history is provided by the patient.   Patient presents with a 1 day history of sore throat.  Patient states that he was at work working outside when he noticed that his throat was getting sore.  He states symptoms worsened this morning upon wakening.  He denies fever, chills, headache, ear pain, cough, abdominal pain, nausea, vomiting, or diarrhea.  Patient states that he did have some nasal congestion, but denies prior history of seasonal allergies.  Patient states that he took over-the-counter cough medicine which did help his symptoms.  States that he took DayQuil with honey this morning upon wakening which has helped.  Past Medical History:  Diagnosis Date   Allergic rhinitis    Eczema    Tachycardia     Patient Active Problem List   Diagnosis Date Noted   Gastroesophageal reflux disease 10/09/2021   Belching 10/09/2021   Motor-verbal tic disorder 11/16/2012    Past Surgical History:  Procedure Laterality Date   NONE         Home Medications    Prior to Admission medications   Medication Sig Start Date End Date Taking? Authorizing Provider  citalopram (CELEXA) 20 MG tablet TAKE 1 TABLET BY MOUTH EVERY DAY Patient not taking: Reported on 07/25/2021 07/13/21   Chevis Pretty, FNP  diphenhydrAMINE (BENADRYL) 50 MG capsule Take 50 mg by mouth every 6 (six) hours as needed.    [provider]  ibuprofen (ADVIL) 200 MG tablet Take 200 mg by mouth every 6 (six) hours as needed for fever or mild pain. Patient not taking: Reported on 07/25/2021    [provider]  ondansetron (ZOFRAN-ODT) 4 MG disintegrating tablet Take 1 tablet (4 mg total) by mouth every 8 (eight) hours as needed for nausea or vomiting. 02/14/22    Volney American, PA-C  pantoprazole (PROTONIX) 40 MG tablet Take 40 mg by mouth daily. Patient not taking: Reported on 07/25/2021 06/27/21   [provider]  predniSONE (DELTASONE) 10 MG tablet Take 40 mg (4 tablets) daily for days 1-3, take 30 mg (3 tablets) days 4-6, take 20 mg (2 tablets) days 7-9, take 10 milligrams (1 tablet) day 10-12 then stop. 02/11/22   Raspet, Derry Skill, PA-C    Family History Family History  Problem Relation Age of Onset   Hypertension Father     Social History Social History   Tobacco Use   Smoking status: Former    Types: Cigarettes, E-cigarettes    Passive exposure: Current   Smokeless tobacco: Never  Vaping Use   Vaping Use: Never used  Substance Use Topics   Alcohol use: Yes    Comment: occasional   Drug use: No     Allergies   Patient has no known allergies.   Review of Systems Per HPI  Physical Exam Triage Vital Signs ED Triage Vitals  Enc Vitals Group     BP 02/22/22 1428 124/81     Pulse Rate 02/22/22 1428 72     Resp 02/22/22 1428 16     Temp 02/22/22 1428 (!) 97.1 F (36.2 C)     Temp Source 02/22/22 1428 Oral     SpO2 02/22/22 1428  98 %     Weight --      Height --      Head Circumference --      Peak Flow --      Pain Score 02/22/22 1429 3     Pain Loc --      Pain Edu? --      Excl. in GC? --    No data found.  Updated Vital Signs BP 124/81 (BP Location: Right Arm)   Pulse 72   Temp (!) 97.1 F (36.2 C) (Oral)   Resp 16   SpO2 98%   Visual Acuity Right Eye Distance:   Left Eye Distance:   Bilateral Distance:    Right Eye Near:   Left Eye Near:    Bilateral Near:     Physical Exam Vitals and nursing note reviewed.  Constitutional:      General: He is not in acute distress.    Appearance: He is well-developed.  HENT:     Head: Normocephalic.     Right Ear: Tympanic membrane and ear canal normal.     Left Ear: Tympanic membrane and ear canal normal.     Nose: Congestion present. No  rhinorrhea.     Mouth/Throat:     Mouth: Mucous membranes are moist.     Pharynx: Uvula midline. Pharyngeal swelling and posterior oropharyngeal erythema present. No oropharyngeal exudate or uvula swelling.     Tonsils: No tonsillar exudate. 1+ on the right. 1+ on the left.  Eyes:     Conjunctiva/sclera: Conjunctivae normal.     Pupils: Pupils are equal, round, and reactive to light.  Cardiovascular:     Rate and Rhythm: Normal rate and regular rhythm.     Heart sounds: Normal heart sounds.  Pulmonary:     Effort: Pulmonary effort is normal. No respiratory distress.     Breath sounds: Normal breath sounds. No stridor. No wheezing, rhonchi or rales.  Abdominal:     General: Bowel sounds are normal.     Palpations: Abdomen is soft.     Tenderness: There is no abdominal tenderness.  Musculoskeletal:     Cervical back: Normal range of motion.  Lymphadenopathy:     Cervical: No cervical adenopathy.  Skin:    General: Skin is warm and dry.  Neurological:     General: No focal deficit present.     Mental Status: He is alert and oriented to person, place, and time.  Psychiatric:        Mood and Affect: Mood normal.        Behavior: Behavior normal.      UC Treatments / Results  Labs (all labs ordered are listed, but only abnormal results are displayed) Labs Reviewed  CULTURE, GROUP A STREP [facility])  POCT RAPID STREP A (OFFICE)    EKG   Radiology No results found.  Procedures Procedures (including critical care time)  Medications Ordered in UC Medications - No data to display  Initial Impression / Assessment and Plan / UC Course  I have reviewed the triage vital signs and the nursing notes.  Pertinent labs & imaging results that were available during my care of the patient were reviewed by me and considered in my medical decision making (see chart for details).  Patient presents with a 1 day history of sore throat.  On exam, patient's vital signs are stable, he has no  acute distress.  He does have +1 tonsil swelling, no exudate is present.  He  has no cervical adenopathy, drooling, or or muffled voice.  Per patient's chart, he does have a history of allergic rhinitis.  Rapid strep test is negative, throat culture is pending.  Patient declined COVID test.  Differential diagnoses include viral pharyngitis, allergic rhinitis, viral upper respiratory infection.  Patient elected to continue over-the-counter medications at this time.  Discussed indications of when he may need to follow-up.  Patient was provided a note for work.  Patient advised he will be contacted if the pending test results are positive.  Patient verbalizes understanding.  All questions were answered. Final Clinical Impressions(s) / UC Diagnoses   Final diagnoses:  Sore throat  Viral illness     Discharge Instructions      Rapid strep test is negative, throat culture is pending.  You will be contacted if your culture results are positive to discuss treatment.  If you have access to MyChart, you can also see the results there. Take medication as prescribed. Increase fluids and allow for plenty of rest. Recommend Ibuprofen 2 to 3 tablets, as needed for pain, fever, or general discomfort. Recommend throat lozenges, Chloraseptic or honey to help with throat pain. Warm salt water gargles 3-4 times daily to help with throat pain or discomfort. Recommend a diet with soft foods to include soups, broths, puddings, yogurt, Jell-O's, or popsicles until symptoms improve. Follow-up if you develop fever, chills, worsening sore throat, or other concerns.    ED Prescriptions   None    PDMP not reviewed this encounter.   Abran Cantor, NP 02/22/22 1512

## 2022-02-22 NOTE — Discharge Instructions (Signed)
Rapid strep test is negative, throat culture is pending.  You will be contacted if your culture results are positive to discuss treatment.  If you have access to MyChart, you can also see the results there. Take medication as prescribed. Increase fluids and allow for plenty of rest. Recommend Ibuprofen 2 to 3 tablets, as needed for pain, fever, or general discomfort. Recommend throat lozenges, Chloraseptic or honey to help with throat pain. Warm salt water gargles 3-4 times daily to help with throat pain or discomfort. Recommend a diet with soft foods to include soups, broths, puddings, yogurt, Jell-O's, or popsicles until symptoms improve. Follow-up if you develop fever, chills, worsening sore throat, or other concerns.

## 2022-02-22 NOTE — ED Triage Notes (Signed)
Sore throat that started yesterday.  States he needs a work note.  Has been using day quil and honey cough drops.

## 2022-02-25 LAB — CULTURE, GROUP A STREP (THRC)

## 2022-02-26 ENCOUNTER — Encounter (HOSPITAL_COMMUNITY): Payer: Self-pay

## 2022-02-26 ENCOUNTER — Emergency Department (HOSPITAL_COMMUNITY)
Admission: EM | Admit: 2022-02-26 | Discharge: 2022-02-26 | Disposition: A | Discharge status: Home / Self Care | Payer: BC Managed Care – PPO | Attending: Emergency Medicine | Admitting: Emergency Medicine

## 2022-02-26 ENCOUNTER — Other Ambulatory Visit: Payer: Self-pay

## 2022-02-26 ENCOUNTER — Emergency Department (HOSPITAL_COMMUNITY): Payer: BC Managed Care – PPO

## 2022-02-26 DIAGNOSIS — R0789 Other chest pain: Secondary | ICD-10-CM | POA: Diagnosis not present

## 2022-02-26 DIAGNOSIS — Z87891 Personal history of nicotine dependence: Secondary | ICD-10-CM | POA: Insufficient documentation

## 2022-02-26 DIAGNOSIS — R079 Chest pain, unspecified: Secondary | ICD-10-CM

## 2022-02-26 MED ORDER — LIDOCAINE 5 % EX PTCH
1.0000 | MEDICATED_PATCH | CUTANEOUS | Status: DC
  Administered 2022-02-26: 1 via TRANSDERMAL
  Filled 2022-02-26: qty 1

## 2022-02-26 MED ORDER — NAPROXEN 500 MG PO TABS: 500.0000 mg | ORAL_TABLET | Freq: Two times a day (BID) | ORAL | 0 refills | Status: DC

## 2022-02-26 MED ORDER — KETOROLAC TROMETHAMINE 15 MG/ML IJ SOLN
30.0000 mg | Freq: Once | INTRAMUSCULAR | Status: AC
Start: 2022-02-26 — End: 2022-02-26
  Administered 2022-02-26: 30 mg via INTRAMUSCULAR
  Filled 2022-02-26: qty 2

## 2022-02-26 MED ORDER — ALUM & MAG HYDROXIDE-SIMETH 200-200-20 MG/5ML PO SUSP
30.0000 mL | Freq: Once | ORAL | Status: AC
  Administered 2022-02-26: 30 mL via ORAL
  Filled 2022-02-26: qty 30

## 2022-02-26 MED ORDER — KETOROLAC TROMETHAMINE 15 MG/ML IJ SOLN: 30.0000 mg | Freq: Once | INTRAMUSCULAR | Status: DC

## 2022-02-26 NOTE — ED Triage Notes (Signed)
Pt c/o intermittent chest pain that started yesterday, states it has gotten worse and his left arm is "tingling". Also c/o nausea.

## 2022-02-26 NOTE — ED Notes (Signed)
Patient transported to X-ray 

## 2022-02-26 NOTE — ED Provider Notes (Signed)
[facility] Emergency Department Provider Note MRN:  [id number]  Arrival date & time: 02/26/22     Chief Complaint   Chest Pain   History of Present Illness   Charles May is a 21 y.o. year-old male with no pertinent past medical history presenting to the ED with chief complaint of chest pain.  Left-sided focal chest pain on and off for several months, this morning the pain is much worse than normal.  Denies dizziness or diaphoresis, no nausea vomiting, no shortness of breath, no leg pain or swelling, no fever, no cough, no abdominal pain.  Described as a sharp pain.  Review of Systems  A thorough review of systems was obtained and all systems are negative except as noted in the HPI and PMH.   Patient's Health History    Past Medical History:  Diagnosis Date   Allergic rhinitis    Eczema    Tachycardia     Past Surgical History:  Procedure Laterality Date   NONE      Family History  Problem Relation Age of Onset   Hypertension Father     Social History   Socioeconomic History   Marital status: Single    Spouse name: Not on file   Number of children: Not on file   Years of education: Not on file   Highest education level: Not on file  Occupational History   Not on file  Tobacco Use   Smoking status: Former    Types: Cigarettes, E-cigarettes    Passive exposure: Current   Smokeless tobacco: Never  Vaping Use   Vaping Use: Never used  Substance and Sexual Activity   Alcohol use: Yes    Comment: occasional   Drug use: No   Sexual activity: Not on file  Other Topics Concern   Not on file  Social History Narrative   Not on file   Social Determinants of Health   Financial Resource Strain: Not on file  Food Insecurity: Not on file  Transportation Needs: Not on file  Physical Activity: Not on file  Stress: Not on file  Social Connections: Not on file  Intimate Partner Violence: Not on file     Physical Exam   Vitals:    02/26/22 0623 02/26/22 0625  BP: (!) 148/96   Pulse: 91 82  Resp: 18   Temp:  98.6 F (37 C)  SpO2: 98% 99%    CONSTITUTIONAL: Well-appearing, NAD NEURO/PSYCH:  Alert and oriented x 3, no focal deficits EYES:  eyes equal and reactive ENT/NECK:  no LAD, no JVD CARDIO: Regular rate, well-perfused, normal S1 and S2 PULM:  CTAB no wheezing or rhonchi GI/GU:  non-distended, non-tender MSK/SPINE:  No gross deformities, no edema SKIN:  no rash, atraumatic   *Additional and/or pertinent findings included in MDM below  Diagnostic and Interventional Summary    EKG Interpretation  Date/Time:  Monday February 26 2022 06:25:48 EDT Ventricular Rate:  91 PR Interval:  132 QRS Duration: 85 QT Interval:  376 QTC Calculation: 463 R Axis:   85 Text Interpretation: Sinus rhythm Confirmed by Gerlene Fee [id number]) on 02/26/2022 6:42:09 AM       Labs Reviewed - No data to display  DG Chest 2 View  Final Result      Medications  alum & mag hydroxide-simeth (MAALOX/MYLANTA) 200-200-20 MG/5ML suspension 30 mL (30 mLs Oral Given 02/26/22 0701)  ketorolac (TORADOL) 15 MG/ML injection 30 mg (30 mg Intramuscular Given 02/26/22 0701)  Procedures  /  Critical Care Procedures  ED Course and Medical Decision Making  Initial Impression and Ddx Differential diagnosis includes MSK pain, pneumothorax, GERD.  Well-appearing, focal and somewhat reproducible pain.  Normal vital signs.  Past medical/surgical history that increases complexity of ED encounter: Chronic chest pain  Interpretation of Diagnostics I personally reviewed the EKG and my interpretation is as follows: Sinus rhythm  Chest x-ray normal  Patient Reassessment and Ultimate Disposition/Management     Patient feeling better after medications listed above, with reassuring EKG or chest x-ray he is appropriate for discharge.  Patient management required discussion with the following services or consulting groups:   None  Complexity of Problems Addressed Acute illness or injury that poses threat of life of bodily function  Additional Data Reviewed and Analyzed Further history obtained from: Further history from spouse/family member  Additional Factors Impacting ED Encounter Risk Prescriptions  Elmer Sow. Pilar Plate, MD [facility] Emergency [facility] [email or web address]  Final Clinical Impressions(s) / ED Diagnoses     ICD-10-CM   1. Chest pain, unspecified type  R07.9       ED Discharge Orders          Ordered    naproxen (NAPROSYN) 500 MG tablet  2 times daily        02/26/22 2956             Discharge Instructions Discussed with and Provided to Patient:    Discharge Instructions      You were evaluated in the Emergency Department and after careful evaluation, we did not find any emergent condition requiring admission or further testing in the hospital.  Your exam/testing today is overall reassuring.  Symptoms seem more muscular in nature.  Recommend taking the Naprosyn anti-inflammatory twice daily.  If pain continues would recommend follow-up with Dr. Wyline Mood of cardiology.  Please return to the Emergency Department if you experience any worsening of your condition.   Thank you for allowing [place] to be a part of your care.      Sabas Sous, MD 02/26/22 [phone]

## 2022-02-26 NOTE — Discharge Instructions (Addendum)
You were evaluated in the Emergency Department and after careful evaluation, we did not find any emergent condition requiring admission or further testing in the hospital.  Your exam/testing today is overall reassuring.  Symptoms seem more muscular in nature.  Recommend taking the Naprosyn anti-inflammatory twice daily.  If pain continues would recommend follow-up with Dr. Harl Bowie of cardiology.  Please return to the Emergency Department if you experience any worsening of your condition.   Thank you for allowing [place] to be a part of your care.

## 2022-03-05 ENCOUNTER — Encounter: Payer: Self-pay | Admitting: Nurse Practitioner

## 2022-03-15 ENCOUNTER — Ambulatory Visit: Payer: BC Managed Care – PPO | Admitting: Physician Assistant

## 2022-03-15 NOTE — Progress Notes (Deleted)
Cardiology Office Note    Date:  03/15/2022   ID:  Charles May, DOB 11/16/2000, MRN [id number]  PCP:  Cory Munch, PA-C  Cardiologist:  Carlyle Dolly, MD  Electrophysiologist:  None   Chief Complaint: ***  History of Present Illness:   Charles May is a 21 y.o. male with history of anxiety, eczema, palpitations who presents for follow-up. He was previously seen for chest pain and palpitations, had had reassuring ER visit 06/2021, seen by Dr. Harl Bowie. Per his interpretation of EKG this was felt to show SR, high voltage likely young age related benign variant. Outpatient monitor 06/2021 showed NSR isolated PVCs, PACs, no significant abnormality. Dr. Harl Bowie had reviewed the interpretation and felt this potentially also showed 1-2 episodes of probable SVT, exacerbated by caffeine intake. No additional workup was felt necessary. He was recently seen in the ER 02/26/22 with chest pain on/off for several months. No labs done at that time but workup felt reassuring and discharged home.  Tsh, labs  Chest pain Palpitations, possible SVT Rare PACs/PVCs  Labwork independently reviewed: 06/2021 CBC wnl, K 4.3, Cr 1.20, LFTs wnl, TSH wnl 02/2021 LDL 107, trig 119, HDL 45  Cardiology Studies:   Studies reviewed are outlined and summarized above. Reports included below if pertinent.   Monitor 06/2021 outlined above    Past Medical History:  Diagnosis Date   Allergic rhinitis    Eczema    Tachycardia     Past Surgical History:  Procedure Laterality Date   NONE      Current Medications: No outpatient medications have been marked as taking for the 03/15/22 encounter (Appointment) with Charlie Pitter, PA-C.   ***   Allergies:   Patient has no known allergies.   Social History   Socioeconomic History   Marital status: Single    Spouse name: Not on file   Number of children: Not on file   Years of education: Not on file   Highest education level: Not on file   Occupational History   Not on file  Tobacco Use   Smoking status: Former    Types: Cigarettes, E-cigarettes    Passive exposure: Current   Smokeless tobacco: Never  Vaping Use   Vaping Use: Never used  Substance and Sexual Activity   Alcohol use: Yes    Comment: occasional   Drug use: No   Sexual activity: Not on file  Other Topics Concern   Not on file  Social History Narrative   Not on file   Social Determinants of Health   Financial Resource Strain: Not on file  Food Insecurity: Not on file  Transportation Needs: Not on file  Physical Activity: Not on file  Stress: Not on file  Social Connections: Not on file     Family History:  The patient's ***family history includes Hypertension in his father.  ROS:   Please see the history of present illness. Otherwise, review of systems is positive for ***.  All other systems are reviewed and otherwise negative.    EKG(s)/Additional Labs   EKG:  EKG is not ordered today,but personally reviewed tracing from recent ER visit 02/26/22 demonstrating NRS 91bpm, tall QRS complexes felt c/w prior. No acute changes  Recent Labs: 06/19/2021: BUN 12; Creatinine, Ser 1.20; Hemoglobin 15.9; Platelets 213; Potassium 4.3; Sodium 138  Recent Lipid Panel    Component Value Date/Time   CHOL 173 03/02/2021 1232   TRIG 119 03/02/2021 1232   HDL 45 03/02/2021 1232  CHOLHDL 3.8 03/02/2021 1232   LDLCALC 107 (H) 03/02/2021 1232    PHYSICAL EXAM:    VS:  There were no vitals taken for this visit.  BMI: There is no height or weight on file to calculate BMI.  GEN: Well nourished, well developed male in no acute distress HEENT: normocephalic, atraumatic Neck: no JVD, carotid bruits, or masses Cardiac: ***RRR; no murmurs, rubs, or gallops, no edema  Respiratory:  clear to auscultation bilaterally, normal work of breathing GI: soft, nontender, nondistended, + BS MS: no deformity or atrophy Skin: warm and dry, no rash Neuro:  Alert and  Oriented x 3, Strength and sensation are intact, follows commands Psych: euthymic mood, full affect  Wt Readings from Last 3 Encounters:  02/26/22 165 lb (74.8 kg)  10/09/21 161 lb 1.6 oz (73.1 kg)  07/25/21 166 lb (75.3 kg)     ASSESSMENT & PLAN:   ***     Disposition: F/u with ***   Medication Adjustments/Labs and Tests Ordered: Current medicines are reviewed at length with the patient today.  Concerns regarding medicines are outlined above. Medication changes, Labs and Tests ordered today are summarized above and listed in the Patient Instructions accessible in Encounters.   Signed, Laurann Montana, PA-C  03/15/2022 8:04 AM    [facility] Phone: [phone]; Fax: [phone]

## 2022-03-25 NOTE — Progress Notes (Unsigned)
Office Visit    Patient Name: Charles May Date of Encounter: 03/26/2022  PCP:  Mann, Benjamin L, [place]  Cardiologist:  Carlyle Dolly, MD  Advanced Practice Provider:  No care team member to display Electrophysiologist:  None   HPI    Charles May is a 21 y.o. male with past medical history for chest pain/palpitations, tachycardia presents today for hospital follow-up.  He was last seen by Dr. Harl Bowie February 2023 and endorsed palpitations.  He took his preworkout which was new for him.  This preworkout was caffeinated.  He worked out without any issues but on the way home he felt like his heart was racing, had some shortness of breath, and lightheadedness.  It lasted for about an hour.  He wore a monitor for 48 hours which did show some mild episodes at the time.  Usually drinks 1 to 2 cups coffee a day, 1 bottle of soda, and red bull every other day with occasional tea.  Occasional alcohol beer/liquor.  Seen in the ER January 2023 with palpitations, chest pain.  Troponins negative x2.  Sinus rhythm, normal thyroid studies.  He reported to the ED 02/26/2022 and stated he was having left-sided focal chest pain on and off for several months but that morning it had been much worse than normal.  Denied dizziness or diaphoresis, no nausea or vomiting, no shortness of breath, no leg pain or swelling, no fever, no cough, no abdominal pain.  Pain described as sharp.  EKG was unremarkable.  Chest x-ray was reassuring.  Patient was given a Toradol injection and felt better.  Discharged on naproxen.  Symptoms thought to be musculoskeletal.  Today, he tells me his chest pain comes and goes.  Is not associated with activity.  He states that during the incident that sent him to the ED he took some preworkout and experienced a sharp pain.  It lasted up to about 10 minutes.  Heart was also beating fast at that time and he has some shortness of breath.  He  wore a monitor in the past and it was unrevealing.  Since then, he is not taking any more preworkout and is stopped smoking cigarettes and marijuana.  Stop drinking alcohol.  He avoids caffeine as well.  He tries to stay hydrated.  We discussed ordering an echocardiogram to look at the structure of his heart and make sure everything is normal.  We also discussed adding an as needed medication for palpitations.  He shares with me that he is engaged and is expecting a baby in the next couple of months.  Reports no shortness of breath nor dyspnea on exertion. Reports no chest pain, pressure, or tightness. No edema, orthopnea, PND. Reports no palpitations.   Past Medical History    Past Medical History:  Diagnosis Date   Allergic rhinitis    Eczema    Tachycardia    Past Surgical History:  Procedure Laterality Date   NONE      Allergies  No Known Allergies  EKGs/Labs/Other Studies Reviewed:   The following studies were reviewed today: none  EKG:  EKG is not ordered today.  Recent Labs: 06/19/2021: BUN 12; Creatinine, Ser 1.20; Hemoglobin 15.9; Platelets 213; Potassium 4.3; Sodium 138  Recent Lipid Panel    Component Value Date/Time   CHOL 173 03/02/2021 1232   TRIG 119 03/02/2021 1232   HDL 45 03/02/2021 1232   CHOLHDL 3.8 03/02/2021 1232  LDLCALC 107 (H) 03/02/2021 1232    Home Medications   Current Meds  Medication Sig   [DISCONTINUED] diphenhydrAMINE (BENADRYL) 50 MG capsule Take 50 mg by mouth every 6 (six) hours as needed.     Review of Systems      All other systems reviewed and are otherwise negative except as noted above.  Physical Exam    VS:  BP (!) 100/58   Pulse 69   Ht 6' (1.829 m)   Wt 175 lb (79.4 kg)   SpO2 98%   BMI 23.73 kg/m  , BMI Body mass index is 23.73 kg/m.  Wt Readings from Last 3 Encounters:  03/26/22 175 lb (79.4 kg)  02/26/22 165 lb (74.8 kg)  10/09/21 161 lb 1.6 oz (73.1 kg)     GEN: Well nourished, well developed, in no  acute distress. HEENT: normal. Neck: Supple, no JVD, carotid bruits, or masses. Cardiac: RRR, no murmurs, rubs, or gallops. No clubbing, cyanosis, edema.  Radials/PT 2+ and equal bilaterally.  Respiratory:  Respirations regular and unlabored, clear to auscultation bilaterally. GI: Soft, nontender, nondistended. MS: No deformity or atrophy. Skin: Warm and dry, no rash. Neuro:  Strength and sensation are intact. Psych: Normal affect.  Assessment & Plan    Chest pain -thought to be musculoskeletal -sharp pains which resolve in about 10 minutes -sometimes they are associated with his palpitations and some SOB -received a Toradol shot in the ED  Palpitations -wore a monitor which was unrevealing for any dangerous arrythmias -will prescribe metoprolol 12.5mg  as needed  -limit caffeine and alcohol -maintain hydration -will check echo to r/o any structural abnormalities   Disposition: Follow up 3 months with Dina Rich, MD or APP.  Signed, Sharlene Dory, PA-C 03/26/2022, 10:55 AM [facility]

## 2022-03-26 ENCOUNTER — Encounter: Payer: Self-pay | Admitting: Physician Assistant

## 2022-03-26 ENCOUNTER — Ambulatory Visit: Payer: BC Managed Care – PPO | Attending: Physician Assistant | Admitting: Physician Assistant

## 2022-03-26 ENCOUNTER — Encounter: Payer: Self-pay | Admitting: *Deleted

## 2022-03-26 VITALS — BP 100/58 | HR 69 | Ht 72.0 in | Wt 175.0 lb

## 2022-03-26 DIAGNOSIS — R002 Palpitations: Secondary | ICD-10-CM

## 2022-03-26 DIAGNOSIS — R079 Chest pain, unspecified: Secondary | ICD-10-CM

## 2022-03-26 MED ORDER — METOPROLOL TARTRATE 25 MG PO TABS: 12.5000 mg | ORAL_TABLET | Freq: Every day | ORAL | 3 refills | Status: AC | PRN

## 2022-03-26 NOTE — Patient Instructions (Signed)
Medication Instructions:  1.Start metoprolol tartrate (Lopressor) 12.5 mg as needed for palpitations, this will be 1/2 of a 25 mg tablet  *If you need a refill on your cardiac medications before your next appointment, please call your pharmacy*   Lab Work: None If you have labs (blood work) drawn today and your tests are completely normal, you will receive your results only by: Short (if you have MyChart) OR A paper copy in the mail If you have any lab test that is abnormal or we need to change your treatment, we will call you to review the results.   Testing/Procedures: Your physician has requested that you have an echocardiogram. Echocardiography is a painless test that uses sound waves to create images of your heart. It provides your doctor with information about the size and shape of your heart and how well your heart's chambers and valves are working. This procedure takes approximately one hour. There are no restrictions for this procedure. Please do NOT wear cologne, perfume, aftershave, or lotions (deodorant is allowed). Please arrive 15 minutes prior to your appointment time.    Follow-Up: At [facility], you and your health needs are our priority.  As part of our continuing mission to provide you with exceptional heart care, we have created designated Provider Care Teams.  These Care Teams include your primary Cardiologist (physician) and Advanced Practice Providers (APPs -  Physician Assistants and Nurse Practitioners) who all work together to provide you with the care you need, when you need it.   Your next appointment:   6 week(s)  The format for your next appointment:   In Person  Provider:   You may see Carlyle Dolly, MD or one of the following Advanced Practice Providers on your designated Care Team:   Bernerd Pho, PA-C  Ermalinda Barrios, PA-C    Other Instructions Schedule an appointment with your primary care provider to discuss your  anxiety.  Important Information About Sugar

## 2022-03-30 ENCOUNTER — Ambulatory Visit (HOSPITAL_COMMUNITY)
Admission: RE | Admit: 2022-03-30 | Discharge: 2022-03-30 | Disposition: A | Discharge status: Home / Self Care | Payer: BC Managed Care – PPO | Source: Ambulatory Visit | Attending: Physician Assistant | Admitting: Physician Assistant

## 2022-03-30 DIAGNOSIS — R002 Palpitations: Secondary | ICD-10-CM | POA: Insufficient documentation

## 2022-03-30 DIAGNOSIS — R079 Chest pain, unspecified: Secondary | ICD-10-CM | POA: Diagnosis not present

## 2022-03-30 LAB — ECHOCARDIOGRAM COMPLETE
AR max vel: 3.49 cm2
AV Area VTI: 3.46 cm2
AV Area mean vel: 3.2 cm2
AV Mean grad: 3.8 mmHg
AV Peak grad: 6.9 mmHg
Ao pk vel: 1.32 m/s
Area-P 1/2: 2.5 cm2
S' Lateral: 2.4 cm

## 2022-03-30 NOTE — Progress Notes (Signed)
*  PRELIMINARY RESULTS* Echocardiogram 2D Echocardiogram has been performed.  Charles May 03/30/2022, 11:17 AM

## 2022-04-04 ENCOUNTER — Telehealth: Payer: Self-pay | Admitting: Physician Assistant

## 2022-04-04 NOTE — Telephone Encounter (Signed)
I went over his Echo results/ Tessa's note with him and he verbalized understanding and I helped him reset his My Chart so he can go in and review on his own and will let us know if he has any further questions.

## 2022-04-04 NOTE — Telephone Encounter (Signed)
Patient calling back to clarify his echo results and the type of leak he was diagnosed with.

## 2022-04-12 ENCOUNTER — Ambulatory Visit (INDEPENDENT_AMBULATORY_CARE_PROVIDER_SITE_OTHER): Payer: BC Managed Care – PPO | Admitting: Gastroenterology

## 2022-04-14 ENCOUNTER — Encounter (INDEPENDENT_AMBULATORY_CARE_PROVIDER_SITE_OTHER): Payer: Self-pay | Admitting: Gastroenterology

## 2022-04-23 NOTE — Progress Notes (Deleted)
Cardiology Office Note:    Date:  04/23/2022   ID:  Charles May, DOB June 14, 2000, MRN [id number]  PCP:  Mann, Benjamin L, [place] Prov[facility] Cardiologist:  Carlyle Dolly, MD { Click to update primary MD,subspecialty MD or APP then REFRESH:1}    Referring MD: Cory Munch, PA-C   Chief Complaint:  No chief complaint on file. {Click here for Visit Info    :1}    History of Present Illness:   Charles May is a 21 y.o. male with history of chest pain and palpitations. He was using a pre-workout supplement and excessive caffeine at the time. Also marijuana.     Last seen by Charles May 03/2022 and metoprolol 12.5 mg prn. She ordered an echo which was normal. Chest pain felt to be M-S.    Past Medical History:  Diagnosis Date   Allergic rhinitis    Eczema    Tachycardia    Current Medications: No outpatient medications have been marked as taking for the 05/07/22 encounter (Appointment) with Charles Burn, PA-C.    Allergies:   Patient has no known allergies.   Social History   Tobacco Use   Smoking status: Former    Types: Cigarettes, E-cigarettes    Passive exposure: Current   Smokeless tobacco: Never  Vaping Use   Vaping Use: Never used  Substance Use Topics   Alcohol use: Yes    Comment: occasional   Drug use: No    Family Hx: The patient's family history includes Hypertension in his father.  ROS     Physical Exam:    VS:  There were no vitals taken for this visit.    Wt Readings from Last 3 Encounters:  03/26/22 175 lb (79.4 kg)  02/26/22 165 lb (74.8 kg)  10/09/21 161 lb 1.6 oz (73.1 kg)    Physical Exam  GEN: Well nourished, well developed, in no acute distress  HEENT: normal  Neck: no JVD, carotid bruits, or masses Cardiac:RRR; no murmurs, rubs, or gallops  Respiratory:  clear to auscultation bilaterally, normal work of breathing GI: soft, nontender, nondistended, + BS Ext: without cyanosis, clubbing, or edema,  Good distal pulses bilaterally Charles: no deformity or atrophy  Skin: warm and dry, no rash Neuro:  Alert and Oriented x 3, Strength and sensation are intact Psych: euthymic mood, full affect        EKGs/Labs/Other Test Reviewed:    EKG:  EKG is *** ordered today.  The ekg ordered today demonstrates ***  Recent Labs: 06/19/2021: BUN 12; Creatinine, Ser 1.20; Hemoglobin 15.9; Platelets 213; Potassium 4.3; Sodium 138   Recent Lipid Panel No results for input(s): "CHOL", "TRIG", "HDL", "VLDL", "LDLCALC", "LDLDIRECT" in the last 8760 hours.   Prior CV Studies: {Select studies to display:26339}   Echo 04/20/2022 IMPRESSIONS     1. Left ventricular ejection fraction, by estimation, is 65 to 70%. The  left ventricle has normal function. The left ventricle has no regional  wall motion abnormalities. Left ventricular diastolic parameters were  normal.   2. Right ventricular systolic function is normal. The right ventricular  size is normal. Tricuspid regurgitation signal is inadequate for assessing  PA pressure.   3. The mitral valve is normal in structure. No evidence of mitral valve  regurgitation. No evidence of mitral stenosis.   4. The aortic valve is tricuspid. Aortic valve regurgitation is not  visualized. No aortic stenosis is present.   5. The inferior vena cava  is normal in size with greater than 50%  respiratory variability, suggesting right atrial pressure of 3 mmHg.     Risk Assessment/Calculations/Metrics:   {Does this patient have ATRIAL FIBRILLATION?:[id number]}     No BP recorded.  {Refresh Note OR Click here to enter BP  :1}***    ASSESSMENT & PLAN:   No problem-specific Assessment & Plan notes found for this encounter.   Palpitations monitor unrevealing, limit caffeine and alcohol, hydrate  History of chest pain felt M-S.      {Are you ordering a CV Procedure (e.g. stress test, cath, DCCV, TEE, etc)?   Press F2        :[id number]}   Dispo:  No follow-ups on  file.   Medication Adjustments/Labs and Tests Ordered: Current medicines are reviewed at length with the patient today.  Concerns regarding medicines are outlined above.  Tests Ordered: No orders of the defined types were placed in this encounter.  Medication Changes: No orders of the defined types were placed in this encounter.  Signed, Jacolyn Reedy, PA-C  04/23/2022 1:33 PM    [facility] [address] [place], [place], [place]  [address] Phone: [phone]; Fax: [phone]

## 2022-05-07 ENCOUNTER — Ambulatory Visit: Payer: BC Managed Care – PPO | Admitting: Physician Assistant

## 2022-05-07 DIAGNOSIS — R002 Palpitations: Secondary | ICD-10-CM

## 2022-05-07 DIAGNOSIS — R079 Chest pain, unspecified: Secondary | ICD-10-CM

## 2022-05-08 ENCOUNTER — Encounter: Payer: Self-pay | Admitting: Physician Assistant

## 2022-05-21 DIAGNOSIS — A059 Bacterial foodborne intoxication, unspecified: Secondary | ICD-10-CM | POA: Diagnosis not present

## 2022-05-21 DIAGNOSIS — Z6828 Body mass index (BMI) 28.0-28.9, adult: Secondary | ICD-10-CM | POA: Diagnosis not present

## 2022-06-13 DIAGNOSIS — J0101 Acute recurrent maxillary sinusitis: Secondary | ICD-10-CM | POA: Diagnosis not present

## 2022-06-13 DIAGNOSIS — Z6828 Body mass index (BMI) 28.0-28.9, adult: Secondary | ICD-10-CM | POA: Diagnosis not present

## 2022-06-13 DIAGNOSIS — J029 Acute pharyngitis, unspecified: Secondary | ICD-10-CM | POA: Diagnosis not present

## 2022-06-13 DIAGNOSIS — E6609 Other obesity due to excess calories: Secondary | ICD-10-CM | POA: Diagnosis not present

## 2022-07-23 ENCOUNTER — Other Ambulatory Visit (HOSPITAL_COMMUNITY): Payer: Self-pay | Admitting: Internal Medicine

## 2022-07-23 DIAGNOSIS — K2100 Gastro-esophageal reflux disease with esophagitis, without bleeding: Secondary | ICD-10-CM | POA: Diagnosis not present

## 2022-07-23 DIAGNOSIS — R0600 Dyspnea, unspecified: Secondary | ICD-10-CM

## 2022-07-23 DIAGNOSIS — S29011A Strain of muscle and tendon of front wall of thorax, initial encounter: Secondary | ICD-10-CM | POA: Diagnosis not present

## 2022-07-26 ENCOUNTER — Ambulatory Visit (HOSPITAL_COMMUNITY)
Admission: RE | Admit: 2022-07-26 | Discharge: 2022-07-26 | Disposition: A | Discharge status: Home / Self Care | Payer: BC Managed Care – PPO | Source: Ambulatory Visit | Attending: Internal Medicine | Admitting: Internal Medicine

## 2022-07-26 DIAGNOSIS — R0602 Shortness of breath: Secondary | ICD-10-CM | POA: Diagnosis not present

## 2022-07-26 DIAGNOSIS — R0600 Dyspnea, unspecified: Secondary | ICD-10-CM | POA: Diagnosis not present

## 2022-08-10 ENCOUNTER — Other Ambulatory Visit (HOSPITAL_COMMUNITY): Payer: Self-pay | Admitting: Internal Medicine

## 2022-08-10 ENCOUNTER — Encounter: Payer: Self-pay | Admitting: Internal Medicine

## 2022-08-10 DIAGNOSIS — G458 Other transient cerebral ischemic attacks and related syndromes: Secondary | ICD-10-CM

## 2022-08-10 DIAGNOSIS — K2100 Gastro-esophageal reflux disease with esophagitis, without bleeding: Secondary | ICD-10-CM | POA: Diagnosis not present

## 2022-08-10 DIAGNOSIS — E663 Overweight: Secondary | ICD-10-CM | POA: Diagnosis not present

## 2022-08-10 DIAGNOSIS — Z6828 Body mass index (BMI) 28.0-28.9, adult: Secondary | ICD-10-CM | POA: Diagnosis not present

## 2022-08-10 DIAGNOSIS — M79602 Pain in left arm: Secondary | ICD-10-CM | POA: Diagnosis not present

## 2022-08-14 ENCOUNTER — Ambulatory Visit (HOSPITAL_COMMUNITY): Admission: RE | Admit: 2022-08-14 | Payer: BC Managed Care – PPO | Source: Ambulatory Visit

## 2022-08-14 ENCOUNTER — Encounter (HOSPITAL_COMMUNITY): Payer: Self-pay

## 2022-08-23 ENCOUNTER — Ambulatory Visit (HOSPITAL_COMMUNITY): Payer: BC Managed Care – PPO

## 2022-10-07 IMAGING — DX DG CHEST 2V
2 series · 2 of 2 positions shown · non-contrast
Comparison: 06/19/2021

CLINICAL DATA: Left chest intercostal pain

EXAM:
CHEST - 2 VIEW

[chest pa]
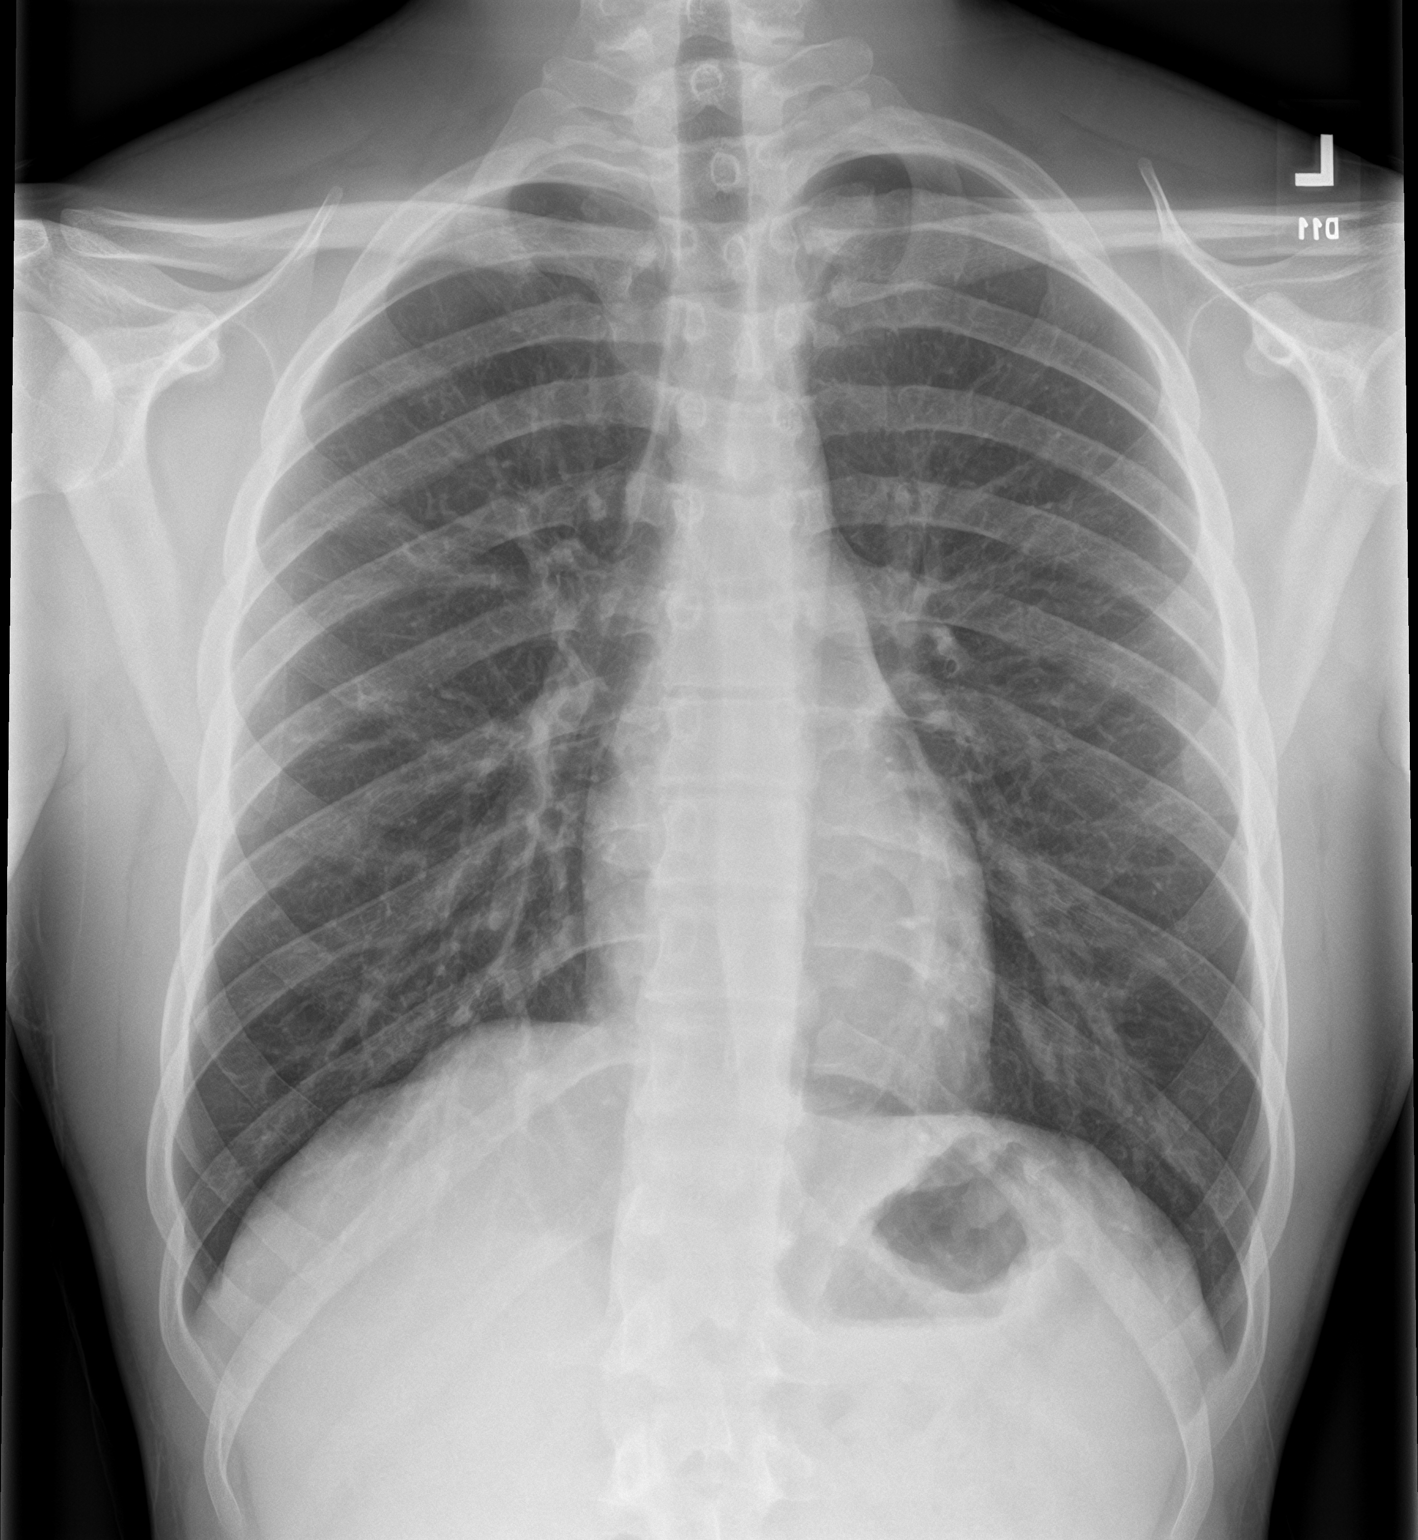

[chest lat]
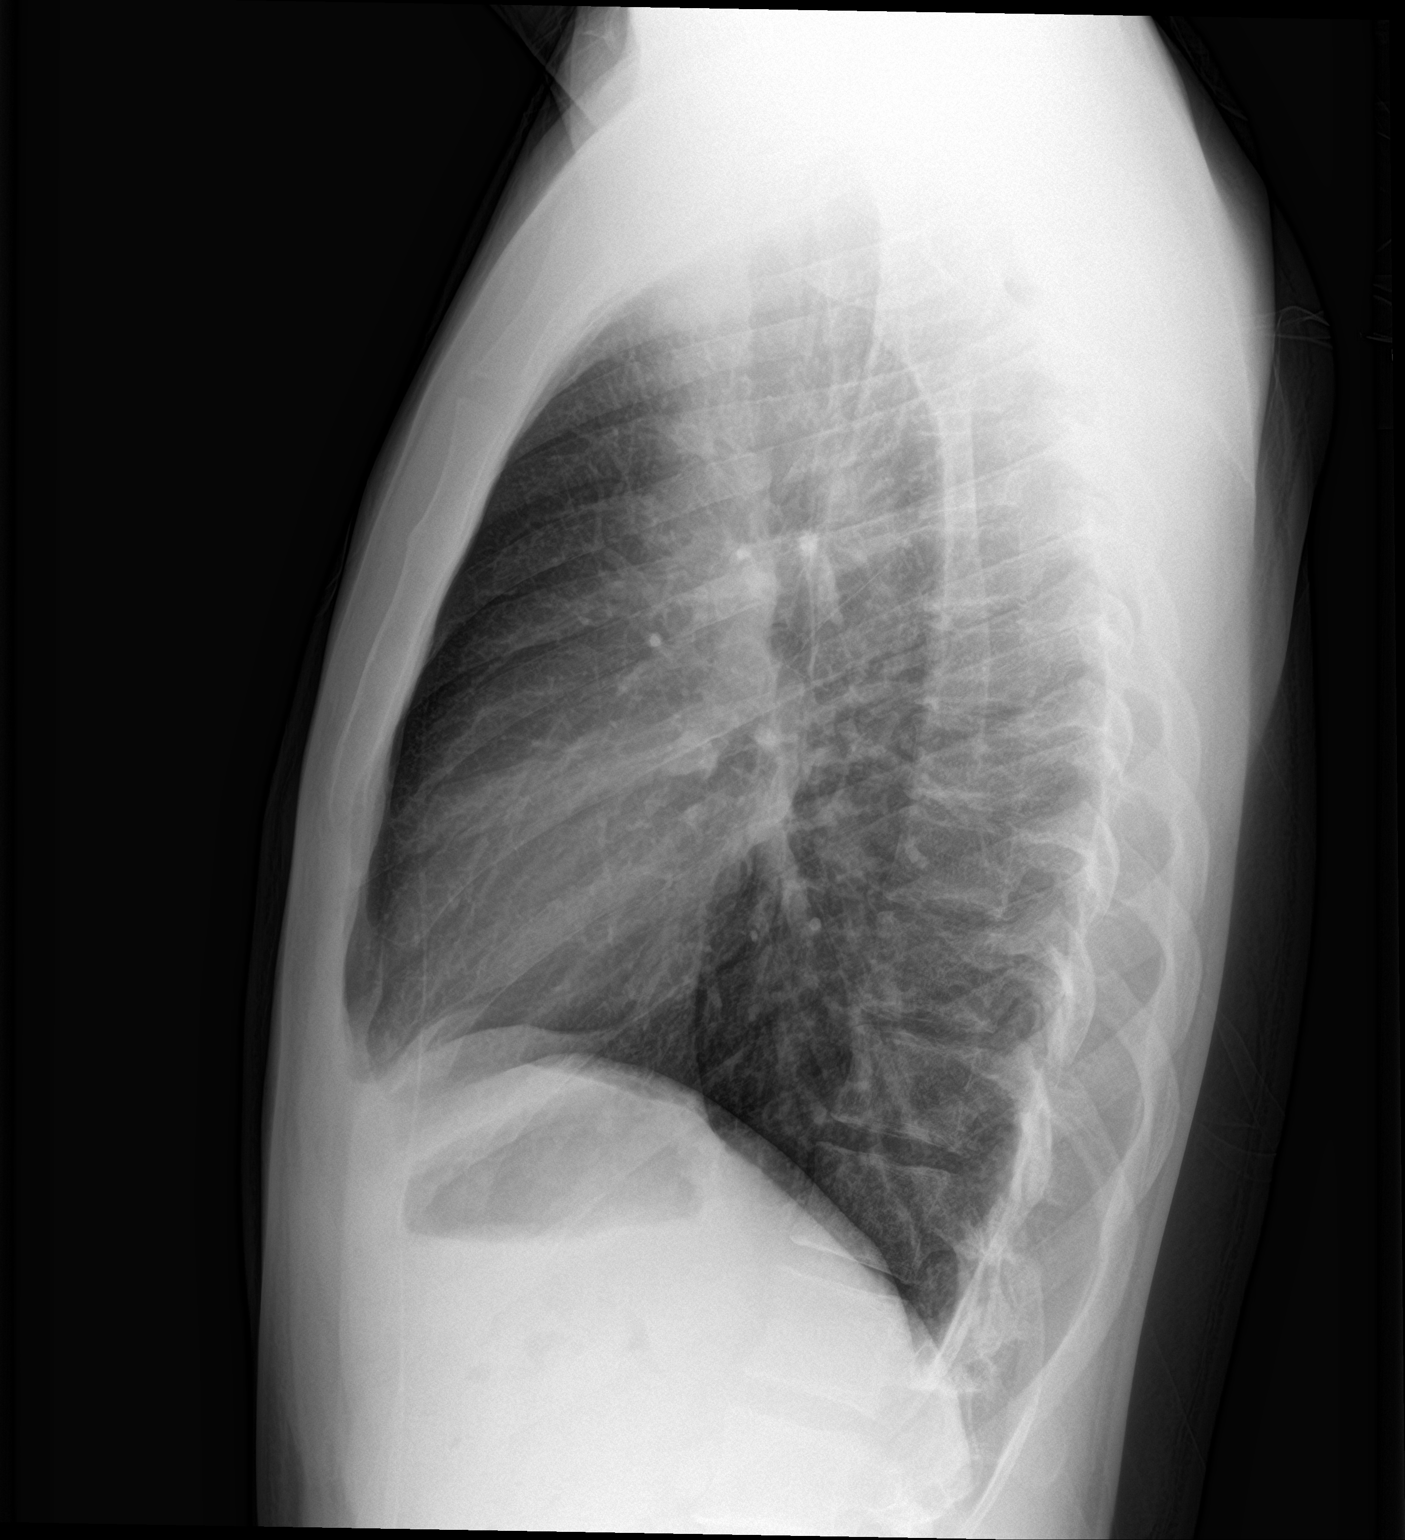

[2 of 2 positions shown; findings below may reference images not displayed]

FINDINGS: The heart size and mediastinal contours are within normal limits.
Both lungs are clear. The visualized skeletal structures are
unremarkable.
IMPRESSION: No active cardiopulmonary disease.

## 2022-10-22 ENCOUNTER — Ambulatory Visit (HOSPITAL_COMMUNITY): Payer: BC Managed Care – PPO

## 2023-03-08 DIAGNOSIS — L600 Ingrowing nail: Secondary | ICD-10-CM | POA: Diagnosis not present

## 2023-03-08 DIAGNOSIS — Z6826 Body mass index (BMI) 26.0-26.9, adult: Secondary | ICD-10-CM | POA: Diagnosis not present

## 2023-03-08 DIAGNOSIS — S29011A Strain of muscle and tendon of front wall of thorax, initial encounter: Secondary | ICD-10-CM | POA: Diagnosis not present

## 2023-04-01 DIAGNOSIS — R6889 Other general symptoms and signs: Secondary | ICD-10-CM | POA: Diagnosis not present

## 2023-04-01 DIAGNOSIS — Z6826 Body mass index (BMI) 26.0-26.9, adult: Secondary | ICD-10-CM | POA: Diagnosis not present

## 2023-04-01 DIAGNOSIS — J0100 Acute maxillary sinusitis, unspecified: Secondary | ICD-10-CM | POA: Diagnosis not present

## 2023-04-01 DIAGNOSIS — Z20828 Contact with and (suspected) exposure to other viral communicable diseases: Secondary | ICD-10-CM | POA: Diagnosis not present

## 2023-04-01 DIAGNOSIS — E663 Overweight: Secondary | ICD-10-CM | POA: Diagnosis not present

## 2023-04-01 DIAGNOSIS — J209 Acute bronchitis, unspecified: Secondary | ICD-10-CM | POA: Diagnosis not present

## 2023-07-05 DIAGNOSIS — R6889 Other general symptoms and signs: Secondary | ICD-10-CM | POA: Diagnosis not present

## 2023-07-05 DIAGNOSIS — Z6827 Body mass index (BMI) 27.0-27.9, adult: Secondary | ICD-10-CM | POA: Diagnosis not present

## 2023-07-05 DIAGNOSIS — J101 Influenza due to other identified influenza virus with other respiratory manifestations: Secondary | ICD-10-CM | POA: Diagnosis not present

## 2023-07-05 DIAGNOSIS — Z20828 Contact with and (suspected) exposure to other viral communicable diseases: Secondary | ICD-10-CM | POA: Diagnosis not present

## 2023-07-05 DIAGNOSIS — E663 Overweight: Secondary | ICD-10-CM | POA: Diagnosis not present

## 2023-08-30 DIAGNOSIS — B349 Viral infection, unspecified: Secondary | ICD-10-CM | POA: Diagnosis not present

## 2023-08-30 DIAGNOSIS — Z6826 Body mass index (BMI) 26.0-26.9, adult: Secondary | ICD-10-CM | POA: Diagnosis not present

## 2024-01-22 DIAGNOSIS — Z23 Encounter for immunization: Secondary | ICD-10-CM | POA: Diagnosis not present

## 2024-03-18 ENCOUNTER — Encounter (INDEPENDENT_AMBULATORY_CARE_PROVIDER_SITE_OTHER): Payer: Self-pay | Admitting: Gastroenterology

## 2024-05-05 ENCOUNTER — Emergency Department (HOSPITAL_BASED_OUTPATIENT_CLINIC_OR_DEPARTMENT_OTHER)
Admission: EM | Admit: 2024-05-05 | Discharge: 2024-05-05 | Disposition: A | Discharge status: Home / Self Care | Payer: Worker's Compensation | Attending: Emergency Medicine | Admitting: Emergency Medicine

## 2024-05-05 ENCOUNTER — Other Ambulatory Visit: Payer: Self-pay

## 2024-05-05 DIAGNOSIS — S060X0A Concussion without loss of consciousness, initial encounter: Secondary | ICD-10-CM

## 2024-05-05 MED ORDER — METHOCARBAMOL 500 MG PO TABS: 500.0000 mg | ORAL_TABLET | Freq: Two times a day (BID) | ORAL | 0 refills | Status: AC

## 2024-05-05 MED ORDER — ACETAMINOPHEN 500 MG PO TABS
1000.0000 mg | ORAL_TABLET | Freq: Once | ORAL | Status: AC
  Administered 2024-05-05: 1000 mg via ORAL
  Filled 2024-05-05: qty 2
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
## 2024-05-05 NOTE — Discharge Instructions (Addendum)
 Please follow-up with your primary care provider in the next 48-72 hours for recheck.  You could also follow-up with the [facility] by calling [phone].  Seek emergency care if experiencing any new or worsening symptoms.  Alternating between 650 mg Tylenol and 400 mg Advil: The best way to alternate taking Acetaminophen (example Tylenol) and Ibuprofen (example Advil/Motrin) is to take them 3 hours apart. For example, if you take ibuprofen at 6 am you can then take Tylenol at 9 am. You can continue this regimen throughout the day, making sure you do not exceed the recommended maximum dose for each drug.
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
